# Patient Record
Sex: Female | Born: 1990 | Race: White | Hispanic: No | Marital: Married | Smoking: Never smoker
Health system: Southern US, Community
[De-identification: ages and names within clinical notes are randomized; demographics above are authoritative.]

## PROBLEM LIST (undated history)

## (undated) ENCOUNTER — Inpatient Hospital Stay (HOSPITAL_COMMUNITY): Payer: Self-pay

## (undated) DIAGNOSIS — F32A Depression, unspecified: Secondary | ICD-10-CM

## (undated) DIAGNOSIS — Q211 Atrial septal defect, unspecified: Secondary | ICD-10-CM

## (undated) DIAGNOSIS — N390 Urinary tract infection, site not specified: Secondary | ICD-10-CM

## (undated) DIAGNOSIS — D649 Anemia, unspecified: Secondary | ICD-10-CM

## (undated) HISTORY — PX: TONSILLECTOMY: SUR1361

## (undated) HISTORY — DX: Atrial septal defect, unspecified: Q21.10

## (undated) HISTORY — DX: Atrial septal defect: Q21.1

---
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
## 2010-10-15 DIAGNOSIS — A90 Dengue fever [classical dengue]: Secondary | ICD-10-CM

## 2010-10-15 HISTORY — DX: Dengue fever (classical dengue): A90

## 2013-10-15 DIAGNOSIS — Q211 Atrial septal defect, unspecified: Secondary | ICD-10-CM

## 2013-10-15 HISTORY — DX: Atrial septal defect, unspecified: Q21.10

## 2013-10-15 HISTORY — PX: ASD REPAIR: SHX258

## 2013-10-15 HISTORY — PX: OTHER SURGICAL HISTORY: SHX169

## 2020-10-15 NOTE — L&D Delivery Note (Signed)
OB/GYN Faculty Practice Delivery Note  Bridget Price is a 30 y.o. G2P0010 s/p NSVD at [redacted]w[redacted]d. She was admitted for PROM.   ROM: 57h 70m with clear fluid GBS Status: negative Maximum Maternal Temperature: 102.2, received IV Ampicillin/Gentamycin during labor  Labor Progress: Presented after PROM on 8/20 at 1445, received cytotec x2 and foley balloon that came out, then received pitocin and progressed to fully. Head was at zero station and patient labored down for ~30 minutes and then started pushing. After one hour of pushing, patient was labored down further for another 30 minutes due to maternal fatigue. After that patient pushed again and delivered as detailed below.  Delivery Date/Time: 0021 on 06/06/2021 Delivery: Called to room and patient was pushing. Head delivered. Loose nuchal cord present and was delivered through . Shoulder and body delivered in usual fashion. Infant with spontaneous cry, placed on mother's abdomen, dried and stimulated. Cord clamped x 2 after 1-minute delay, and cut by father of baby Jimmey Ralph. Cord blood drawn. Placenta delivered spontaneously with gentle cord traction. Fundus firm with massage and Pitocin. Labia, perineum, vagina, and cervix inspected, and patient found to have a second degree laceration that extended to the right and was repaired with 3-0 vicryl in a running fashion. Initially the extension to the right was bleeding, however there was excellent hemostasis after the suture was placed. Patient was given TXA for an EBL >500 with continual oozing from the laceration prior to hemostasis was achieved with the repair  Placenta: Intact, three vessel cord, sent to L&D Complications: None Lacerations: 2nd degree EBL: 550 Analgesia: Epidural    Infant: Female  APGARs 8,9  weight pending.  Warner Mccreedy, MD, MPH OB Fellow, [facility] for [facility], [facility] 06/06/2021, 1:49 AM

## 2020-11-03 ENCOUNTER — Encounter (HOSPITAL_COMMUNITY): Payer: Self-pay | Admitting: Obstetrics & Gynecology

## 2020-11-03 ENCOUNTER — Inpatient Hospital Stay (HOSPITAL_COMMUNITY)
Admission: AD | Admit: 2020-11-03 | Discharge: 2020-11-03 | Disposition: A | Discharge status: Home / Self Care | Payer: No Typology Code available for payment source | Attending: Obstetrics & Gynecology | Admitting: Obstetrics & Gynecology

## 2020-11-03 ENCOUNTER — Inpatient Hospital Stay (HOSPITAL_COMMUNITY): Payer: No Typology Code available for payment source

## 2020-11-03 ENCOUNTER — Other Ambulatory Visit: Payer: Self-pay

## 2020-11-03 DIAGNOSIS — O4691 Antepartum hemorrhage, unspecified, first trimester: Secondary | ICD-10-CM

## 2020-11-03 DIAGNOSIS — Z3490 Encounter for supervision of normal pregnancy, unspecified, unspecified trimester: Secondary | ICD-10-CM

## 2020-11-03 DIAGNOSIS — Z3A01 Less than 8 weeks gestation of pregnancy: Secondary | ICD-10-CM

## 2020-11-03 DIAGNOSIS — O209 Hemorrhage in early pregnancy, unspecified: Secondary | ICD-10-CM | POA: Diagnosis not present

## 2020-11-03 LAB — CBC
HCT: 39.5 % (ref 36.0–46.0)
Hemoglobin: 13.3 g/dL (ref 12.0–15.0)
MCH: 31 pg (ref 26.0–34.0)
MCHC: 33.7 g/dL (ref 30.0–36.0)
MCV: 92.1 fL (ref 80.0–100.0)
Platelets: 212 10*3/uL (ref 150–400)
RBC: 4.29 MIL/uL (ref 3.87–5.11)
RDW: 11.8 % (ref 11.5–15.5)
WBC: 5.4 10*3/uL (ref 4.0–10.5)
nRBC: 0 % (ref 0.0–0.2)

## 2020-11-03 LAB — POCT PREGNANCY, URINE: Preg Test, Ur: POSITIVE — AB

## 2020-11-03 LAB — ABO/RH: ABO/RH(D): A POS

## 2020-11-03 LAB — HCG, QUANTITATIVE, PREGNANCY: hCG, Beta Chain, Quant, S: 29516 m[IU]/mL — ABNORMAL HIGH (ref <5)

## 2020-11-03 NOTE — Discharge Instructions (Signed)
Constipation, Adult Constipation is when a person has trouble pooping (having a bowel movement). When you have this condition, you may poop fewer than 3 times a week. Your poop (stool) may also be dry, hard, or bigger than normal. Follow these instructions at home: Eating and drinking  Eat foods that have a lot of fiber, such as: ? Fresh fruits and vegetables. ? Whole grains. ? Beans.  Eat less of foods that are low in fiber and high in fat and sugar, such as: ? French fries. ? Hamburgers. ? Cookies. ? Candy. ? Soda.  Drink enough fluid to keep your pee (urine) pale yellow.   General instructions  Exercise regularly or as told by your doctor. Try to do 150 minutes of exercise each week.  Go to the restroom when you feel like you need to poop. Do not hold it in.  Take over-the-counter and prescription medicines only as told by your doctor. These include any fiber supplements.  When you poop: ? Do deep breathing while relaxing your lower belly (abdomen). ? Relax your pelvic floor. The pelvic floor is a group of muscles that support the rectum, bladder, and intestines (as well as the uterus in women).  Watch your condition for any changes. Tell your doctor if you notice any.  Keep all follow-up visits as told by your doctor. This is important. Contact a doctor if:  You have pain that gets worse.  You have a fever.  You have not pooped for 4 days.  You vomit.  You are not hungry.  You lose weight.  You are bleeding from the opening of the butt (anus).  You have thin, pencil-like poop. Get help right away if:  You have a fever, and your symptoms suddenly get worse.  You leak poop or have blood in your poop.  Your belly feels hard or bigger than normal (bloated).  You have very bad belly pain.  You feel dizzy or you faint. Summary  Constipation is when a person poops fewer than 3 times a week, has trouble pooping, or has poop that is dry, hard, or bigger than  normal.  Eat foods that have a lot of fiber.  Drink enough fluid to keep your pee (urine) pale yellow.  Take over-the-counter and prescription medicines only as told by your doctor. These include any fiber supplements. This information is not intended to replace advice given to you by your health care provider. Make sure you discuss any questions you have with your health care provider. Document Revised: 08/19/2019 Document Reviewed: 08/19/2019 Elsevier Patient Education  2021 Elsevier Inc.  

## 2020-11-03 NOTE — MAU Note (Signed)
Pt reports she has had cramping for the past 3-4 days, got a little worse today. Went o the BR and had some bleeding and then passed a few small clots.  Last  Intercourse Monday.

## 2020-11-03 NOTE — MAU Provider Note (Signed)
History     CSN: [id number]  Arrival date and time: 11/03/20 0865   Event Date/Time   First Provider Initiated Contact with Patient 11/03/20 2026      Chief Complaint  Patient presents with  . Vaginal Bleeding   HPI Bridget Price is a 30 y.o. G1P0 at [redacted]w[redacted]d by LMP who presents to MAU with chief complaint of abdominal cramping. This is a new problem, onset about 4 days ago. Her pain is suprapubic, does not radiate but became slightly more intense today. She denies aggravating or alleviating factors.   Patient also c/o vaginal bleeding. This is a new problem, onset today, first visualized when she wiped after voiding. She endorses seeing multiple small clots but estimates they were "very small' and "not even the size of a dime". She denies overt active bleeding. Most recent sexual intercourse Monday 10/31/2020.  Patient reports constipation, new onset this week. She has attempted management with daily Miralax in her water bottle but has not experienced relief. She is intermittently adherent to a Paleo approach to nutrition. She endorses one fast food meal in the past few weeks.  Patient has a New OB appointment scheduled for 11/17/2020.  OB History    Gravida  1   Para      Term      Preterm      AB      Living        SAB      IAB      Ectopic      Multiple      Live Births              History reviewed. No pertinent past medical history.  Past Surgical History:  Procedure Laterality Date  . ASD REPAIR  2015  . TONSILLECTOMY      History reviewed. No pertinent family history.  Social History   Tobacco Use  . Smoking status: Never Smoker  Substance Use Topics  . Alcohol use: Not Currently  . Drug use: Never    Allergies: No Known Allergies  No medications prior to admission.    Review of Systems  Gastrointestinal: Positive for abdominal pain.  Genitourinary: Positive for vaginal bleeding.  All other systems reviewed and are  negative.  Physical Exam   Blood pressure 120/69, pulse 80, resp. rate 18, height 5\' 3"  (1.6 m), weight 53.1 kg, last menstrual period 09/21/2020.  Physical Exam Vitals and nursing note reviewed. Exam conducted with a chaperone present.  Constitutional:      Appearance: Normal appearance.  Cardiovascular:     Rate and Rhythm: Normal rate.     Pulses: Normal pulses.     Heart sounds: Normal heart sounds.  Pulmonary:     Effort: Pulmonary effort is normal.     Breath sounds: Normal breath sounds.  Abdominal:     General: Abdomen is flat. Bowel sounds are normal.     Tenderness: There is no abdominal tenderness. There is no right CVA tenderness or left CVA tenderness.  Skin:    Capillary Refill: Capillary refill takes less than 2 seconds.  Neurological:     Mental Status: She is alert and oriented to person, place, and time.  Psychiatric:        Mood and Affect: Mood normal.        Behavior: Behavior normal.        Thought Content: Thought content normal.        Judgment: Judgment normal.    MAU  Course  Procedures  --Discussed gas and constipation as common first trimester complaints and common Paleo complaints. Advised increasing cardiovascular activity, continue Miralax PRN, focus on leafy green vegetables, avoid vegetables known to trigger gas e.g. broccoli  --Discussed concern for fetal heart rate of 52, typically would expect heart rate closer to 100 bpm. No actions required and no restrictions on patient activity beyond typical pregnancy restrictions.  Orders Placed This Encounter  Procedures  . US OB LESS THAN 14 WEEKS WITH OB TRANSVAGINAL  . CBC  . hCG, quantitative, pregnancy  . Pregnancy, urine POC  . ABO/Rh  . Discharge patient   Patient Vitals for the past 24 hrs:  BP Pulse Resp Height Weight  11/03/20 1858 120/69 80 18 5\' 3"  (1.6 m) 53.1 kg   Results for orders placed or performed during the hospital encounter of 11/03/20 (from the past 24 hour(s))   Pregnancy, urine POC     Status: Abnormal   Collection Time: 11/03/20  7:13 PM  Result Value Ref Range   Preg Test, Ur POSITIVE (A) NEGATIVE  CBC     Status: None   Collection Time: 11/03/20  7:30 PM  Result Value Ref Range   WBC 5.4 4.0 - 10.5 K/uL   RBC 4.29 3.87 - 5.11 MIL/uL   Hemoglobin 13.3 12.0 - 15.0 g/dL   HCT 11/05/20 66.0 - 63.0 %   MCV 92.1 80.0 - 100.0 fL   MCH 31.0 26.0 - 34.0 pg   MCHC 33.7 30.0 - 36.0 g/dL   RDW 16.0 10.9 - 32.3 %   Platelets 212 150 - 400 K/uL   nRBC 0.0 0.0 - 0.2 %  hCG, quantitative, pregnancy     Status: Abnormal   Collection Time: 11/03/20  7:30 PM  Result Value Ref Range   hCG, Beta Chain, Quant, S 29,516 (H) <5 mIU/mL  ABO/Rh     Status: None   Collection Time: 11/03/20  7:30 PM  Result Value Ref Range   ABO/RH(D) A POS    No rh immune globuloin      NOT A RH IMMUNE GLOBULIN CANDIDATE, PT RH POSITIVE Performed at [facility], [address], [place], [place]    [address] OB LESS THAN 14 WEEKS WITH US TRANSVAGINAL  Result Date: 11/03/2020 CLINICAL DATA:  Cramps. Estimated gestational age by last menstrual period equals 6 weeks 1 day EXAM: OBSTETRIC <14 WK 11/05/2020 AND TRANSVAGINAL OB US TECHNIQUE: Both transabdominal and transvaginal ultrasound examinations were performed for complete evaluation of the gestation as well as the maternal uterus, adnexal regions, and pelvic cul-de-sac. Transvaginal technique was performed to assess early pregnancy. COMPARISON:  None. FINDINGS: Intrauterine gestational sac: Single Yolk sac:  Present Embryo:  Present Cardiac Activity: Present Heart Rate: 52 bpm MSD: 14 mm   6 w    2 d CRL:  1.7 mm --  too small to calculate estimated gestational age Subchorionic hemorrhage:  None Maternal uterus/adnexae: Normal uterus and ovaries.  No free fluid IMPRESSION: 1. Single intrauterine gestation with embryo and detectable cardiac activity. 2. Estimated gestational age by mean sac diameter equal 6 weeks 2 days  Electronically Signed   By: [place] M.D.   On: 11/03/2020 20:17   Assessment and Plan  --30 y.o. G1P0 with confirmed IUP c/w LMP --Fetal heart rate noted to be 52 bpm --Blood type A POS --Discharge home in stable condition  F/U: --Patient to keep New OB appt for 11/17/2020  01/15/2021, CNM  11/03/2020, 9:14 PM

## 2020-11-04 ENCOUNTER — Telehealth: Payer: Self-pay | Admitting: General Practice

## 2020-11-17 ENCOUNTER — Ambulatory Visit (INDEPENDENT_AMBULATORY_CARE_PROVIDER_SITE_OTHER): Payer: No Typology Code available for payment source | Admitting: Family Medicine

## 2020-11-17 ENCOUNTER — Encounter: Payer: Self-pay | Admitting: Family Medicine

## 2020-11-17 ENCOUNTER — Other Ambulatory Visit (HOSPITAL_COMMUNITY)
Admission: RE | Admit: 2020-11-17 | Discharge: 2020-11-17 | Disposition: A | Discharge status: Home / Self Care | Payer: No Typology Code available for payment source | Source: Ambulatory Visit | Attending: Family Medicine | Admitting: Family Medicine

## 2020-11-17 ENCOUNTER — Other Ambulatory Visit: Payer: Self-pay

## 2020-11-17 VITALS — BP 107/45 | HR 70 | Wt 119.0 lb

## 2020-11-17 DIAGNOSIS — Z8774 Personal history of (corrected) congenital malformations of heart and circulatory system: Secondary | ICD-10-CM | POA: Insufficient documentation

## 2020-11-17 DIAGNOSIS — Z3400 Encounter for supervision of normal first pregnancy, unspecified trimester: Secondary | ICD-10-CM | POA: Diagnosis present

## 2020-11-17 DIAGNOSIS — Z3401 Encounter for supervision of normal first pregnancy, first trimester: Secondary | ICD-10-CM

## 2020-11-17 DIAGNOSIS — Z3A08 8 weeks gestation of pregnancy: Secondary | ICD-10-CM | POA: Diagnosis not present

## 2020-11-17 NOTE — Progress Notes (Signed)
DATING AND VIABILITY SONOGRAM   Bridget Price is a 30 y.o. year old G1P0 with LMP Patient's last menstrual period was 09/21/2020. which would correlate to  [redacted]w[redacted]d weeks gestation.  She has regular menstrual cycles.   She is here today for a confirmatory initial sonogram.    GESTATION: SINGLETON     FETAL ACTIVITY:          Heart rate        155 bpm          The fetus is active.    GESTATIONAL AGE AND  BIOMETRICS:  Gestational criteria: Estimated Date of Delivery: 06/28/21 by LMP now at [redacted]w[redacted]d  Previous Scans:1  GESTATIONAL SAC           1.58 cm         8-0 weeks  CROWN RUMP LENGTH           1.64 cm         8-0 weeks                                                                               AVERAGE EGA(BY THIS SCAN): 8-0 weeks  WORKING EDD( LMP ): 06-28-2021     TECHNICIAN COMMENTS: Patient informed that the ultrasound is considered a limited obstetric ultrasound and is not intended to be a complete ultrasound exam. Patient also informed that the ultrasound is not being completed with the intent of assessing for fetal or placental anomalies or any pelvic abnormalities. Explained that the purpose of today's ultrasound is to assess for fetal heart rate. Patient acknowledges the purpose of the exam and the limitations of the study.    Armandina Stammer 11/17/2020 3:10 PM

## 2020-11-17 NOTE — Progress Notes (Signed)
Subjective:  Bridget Price is a G1P0 [redacted]w[redacted]d being seen today for her first obstetrical visit.  Her obstetrical history is significant for personal history of ASD. Repaired approximately 6 years ago. Last echo was about 3 years ago - normal LVEF and no shunting detected. She is pretty active (biking, rock climbing, skiing, etc) without symptoms. Patient does intend to breast feed. Pregnancy history fully reviewed.  Patient reports mild nausea in evening.  BP (!) 107/45   Pulse 70   Wt 119 lb (54 kg)   LMP 09/21/2020   BMI 21.08 kg/m   HISTORY: OB History  Gravida Para Term Preterm AB Living  1            SAB IAB Ectopic Multiple Live Births               # Outcome Date GA Lbr Len/2nd Weight Sex Delivery Anes PTL Lv  1 Current             Past Medical History:  Diagnosis Date  . ASD (atrial septal defect)     Past Surgical History:  Procedure Laterality Date  . ASD REPAIR  2015  . ASD repair  2015  . TONSILLECTOMY    . TONSILLECTOMY      Family History  Problem Relation Age of Onset  . Hypertension Father   . Cancer Maternal Aunt   . Cancer Maternal Grandmother        melnoma and breast  . Cancer Maternal Grandfather        lung CA- smoker  . Cancer Paternal Grandmother        melnoma  . Hypertension Paternal Grandmother   . Cancer Paternal Grandfather        pancreatic     Exam  BP (!) 107/45   Pulse 70   Wt 119 lb (54 kg)   LMP 09/21/2020   BMI 21.08 kg/m   Chaperone present during exam  CONSTITUTIONAL: Well-developed, well-nourished female in no acute distress.  HENT:  Normocephalic, atraumatic, External right and left ear normal. Oropharynx is clear and moist EYES: Conjunctivae and EOM are normal. Pupils are equal, round, and reactive to light. No scleral icterus.  NECK: Normal range of motion, supple, no masses.  Normal thyroid.  CARDIOVASCULAR: Normal heart rate noted, regular rhythm RESPIRATORY: Clear to auscultation bilaterally. Effort and  breath sounds normal, no problems with respiration noted. BREASTS: Symmetric in size. No masses, skin changes, nipple drainage, or lymphadenopathy. ABDOMEN: Soft, normal bowel sounds, no distention noted.  No tenderness, rebound or guarding.  PELVIC: Normal appearing external genitalia; normal appearing vaginal mucosa and cervix. No abnormal discharge noted. Normal uterine size, no other palpable masses, no uterine or adnexal tenderness. MUSCULOSKELETAL: Normal range of motion. No tenderness.  No cyanosis, clubbing, or edema.  2+ distal pulses. SKIN: Skin is warm and dry. No rash noted. Not diaphoretic. No erythema. No pallor. NEUROLOGIC: Alert and oriented to person, place, and time. Normal reflexes, muscle tone coordination. No cranial nerve deficit noted. PSYCHIATRIC: Normal mood and affect. Normal behavior. Normal judgment and thought content.    Assessment:    Pregnancy: G1P0 Patient Active Problem List   Diagnosis Date Noted  . Intrauterine pregnancy 11/03/2020  . FATIGUE 11/26/2008  . ABDOMINAL PAIN 11/26/2008      Plan:   1. Supervision of normal first pregnancy, antepartum FHT and FH normal Discussed delivery at [facility]  Discussed use of midwives, fellows, etc. Interested in [place], but needs to see  if insurance covers it. - CBC/D/Plt+RPR+Rh+ABO+Rub Ab... - Urine Culture - Enroll Patient in Babyscripts - GC/Chlamydia probe amp ([place])not at [facility]  2. History of repair of congenital atrial septal defect (ASD) Will need Fetal echo. At this point, appears remote from surgery and should be fine. Not having any symptoms of shunting. I recommended that she contact her Cardiologist about pregnancy to see if they would recommend an echo.  3. [redacted] weeks gestation of pregnancy  - CBC/D/Plt+RPR+Rh+ABO+Rub Ab... - Urine Culture - Enroll Patient in Babyscripts     Problem list reviewed and updated. 75% of 30 min visit spent on counseling and coordination of care.     Levie Heritage 11/17/2020

## 2020-11-18 LAB — CBC/D/PLT+RPR+RH+ABO+RUB AB...
Antibody Screen: NEGATIVE
Basophils Absolute: 0 10*3/uL (ref 0.0–0.2)
Basos: 1 %
EOS (ABSOLUTE): 0.2 10*3/uL (ref 0.0–0.4)
Eos: 3 %
HCV Ab: <0.1 s/co ratio (ref 0.0–0.9)
HIV Screen 4th Generation wRfx: NONREACTIVE
Hematocrit: 37.7 % (ref 34.0–46.6)
Hemoglobin: 13.2 g/dL (ref 11.1–15.9)
Hepatitis B Surface Ag: NEGATIVE
Immature Grans (Abs): 0 10*3/uL (ref 0.0–0.1)
Immature Granulocytes: 0 %
Lymphocytes Absolute: 1.5 10*3/uL (ref 0.7–3.1)
Lymphs: 24 %
MCH: 31.5 pg (ref 26.6–33.0)
MCHC: 35 g/dL (ref 31.5–35.7)
MCV: 90 fL (ref 79–97)
Monocytes Absolute: 0.4 10*3/uL (ref 0.1–0.9)
Monocytes: 7 %
Neutrophils Absolute: 4 10*3/uL (ref 1.4–7.0)
Neutrophils: 65 %
Platelets: 208 10*3/uL (ref 150–450)
RBC: 4.19 x10E6/uL (ref 3.77–5.28)
RDW: 12.3 % (ref 11.7–15.4)
RPR Ser Ql: NONREACTIVE
Rh Factor: POSITIVE
Rubella Antibodies, IGG: 2.77 index (ref >0.99)
WBC: 6.1 10*3/uL (ref 3.4–10.8)

## 2020-11-18 LAB — HCV INTERPRETATION

## 2020-11-20 LAB — GC/CHLAMYDIA PROBE AMP (~~LOC~~) NOT AT ARMC
Chlamydia: NEGATIVE
Comment: NEGATIVE
Comment: NORMAL
Neisseria Gonorrhea: NEGATIVE

## 2020-11-22 LAB — URINE CULTURE

## 2020-12-14 ENCOUNTER — Other Ambulatory Visit: Payer: Self-pay

## 2020-12-14 ENCOUNTER — Ambulatory Visit (INDEPENDENT_AMBULATORY_CARE_PROVIDER_SITE_OTHER): Payer: No Typology Code available for payment source | Admitting: Family Medicine

## 2020-12-14 VITALS — BP 100/54 | HR 71 | Wt 119.0 lb

## 2020-12-14 DIAGNOSIS — Z8774 Personal history of (corrected) congenital malformations of heart and circulatory system: Secondary | ICD-10-CM

## 2020-12-14 DIAGNOSIS — Z3400 Encounter for supervision of normal first pregnancy, unspecified trimester: Secondary | ICD-10-CM

## 2020-12-14 DIAGNOSIS — Z3A12 12 weeks gestation of pregnancy: Secondary | ICD-10-CM

## 2020-12-14 MED ORDER — ONDANSETRON HCL 4 MG PO TABS: 4.0000 mg | ORAL_TABLET | Freq: Three times a day (TID) | ORAL | 6 refills | Status: DC | PRN

## 2020-12-14 NOTE — Progress Notes (Signed)
   PRENATAL VISIT NOTE  Subjective:  Bridget Price is a 30 y.o. G1P0 at [redacted]w[redacted]d being seen today for ongoing prenatal care.  She is currently monitored for the following issues for this low-risk pregnancy and has FATIGUE; ABDOMINAL PAIN; Intrauterine pregnancy; History of repair of congenital atrial septal defect (ASD); and Supervision of normal first pregnancy, antepartum on their problem list.  Patient reports nausea.  Contractions: Not present. Vag. Bleeding: None.  Movement: Absent. Denies leaking of fluid.   The following portions of the patient's history were reviewed and updated as appropriate: allergies, current medications, past family history, past medical history, past social history, past surgical history and problem list.   Objective:   Vitals:   12/14/20 1112  BP: (!) 100/54  Pulse: 71  Weight: 119 lb (54 kg)    Fetal Status: Fetal Heart Rate (bpm): 160   Movement: Absent     General:  Alert, oriented and cooperative. Patient is in no acute distress.  Skin: Skin is warm and dry. No rash noted.   Cardiovascular: Normal heart rate noted  Respiratory: Normal respiratory effort, no problems with respiration noted  Abdomen: Soft, gravid, appropriate for gestational age.  Pain/Pressure: Absent     Pelvic: Cervical exam deferred        Extremities: Normal range of motion.  Edema: None  Mental Status: Normal mood and affect. Normal behavior. Normal judgment and thought content.   Assessment and Plan:  Pregnancy: G1P0 at [redacted]w[redacted]d 1. [redacted] weeks gestation of pregnancy - [place] MFM OB COMP + 14 WK; Future  2. Supervision of normal first pregnancy, antepartum FHT and FH normal  3. History of repair of congenital atrial septal defect (ASD) Schedule fetal echo  Preterm labor symptoms and general obstetric precautions including but not limited to vaginal bleeding, contractions, leaking of fluid and fetal movement were reviewed in detail with the patient. Please refer to After Visit  Summary for other counseling recommendations.   Return in about 4 weeks (around 01/11/2021) for OB f/u.  Future Appointments  Date Time Provider Department Center  01/12/2021 11:15 AM Levie Heritage, DO [facility] None  02/01/2021 12:45 PM [facility] US4 [facility]    Levie Heritage, DO

## 2020-12-21 ENCOUNTER — Other Ambulatory Visit: Payer: Self-pay

## 2020-12-21 ENCOUNTER — Ambulatory Visit (INDEPENDENT_AMBULATORY_CARE_PROVIDER_SITE_OTHER): Payer: No Typology Code available for payment source

## 2020-12-21 DIAGNOSIS — Z3A13 13 weeks gestation of pregnancy: Secondary | ICD-10-CM

## 2020-12-21 NOTE — Progress Notes (Signed)
Pt presents for [facility] labs. Labs were drawn and sent to [facility]. Bridget Price l Shean Gerding, CMA

## 2021-01-12 ENCOUNTER — Ambulatory Visit (INDEPENDENT_AMBULATORY_CARE_PROVIDER_SITE_OTHER): Payer: No Typology Code available for payment source | Admitting: Family Medicine

## 2021-01-12 VITALS — BP 101/58 | HR 82 | Wt 124.0 lb

## 2021-01-12 DIAGNOSIS — Z3400 Encounter for supervision of normal first pregnancy, unspecified trimester: Secondary | ICD-10-CM

## 2021-01-12 DIAGNOSIS — Z8774 Personal history of (corrected) congenital malformations of heart and circulatory system: Secondary | ICD-10-CM

## 2021-01-12 DIAGNOSIS — Z3A16 16 weeks gestation of pregnancy: Secondary | ICD-10-CM

## 2021-01-12 NOTE — Progress Notes (Signed)
   PRENATAL VISIT NOTE  Subjective:  Bridget Price is a 30 y.o. G1P0 at [redacted]w[redacted]d being seen today for ongoing prenatal care.  She is currently monitored for the following issues for this low-risk pregnancy and has FATIGUE; ABDOMINAL PAIN; Intrauterine pregnancy; History of repair of congenital atrial septal defect (ASD); and Supervision of normal first pregnancy, antepartum on their problem list.  Patient reports no complaints.  Contractions: Not present. Vag. Bleeding: None.  Movement: Absent. Denies leaking of fluid.   The following portions of the patient's history were reviewed and updated as appropriate: allergies, current medications, past family history, past medical history, past social history, past surgical history and problem list.   Objective:   Vitals:   01/12/21 1130  BP: (!) 101/58  Pulse: 82  Weight: 124 lb (56.2 kg)    Fetal Status: Fetal Heart Rate (bpm): 141   Movement: Absent     General:  Alert, oriented and cooperative. Patient is in no acute distress.  Skin: Skin is warm and dry. No rash noted.   Cardiovascular: Normal heart rate noted  Respiratory: Normal respiratory effort, no problems with respiration noted  Abdomen: Soft, gravid, appropriate for gestational age.  Pain/Pressure: Absent     Pelvic: Cervical exam deferred        Extremities: Normal range of motion.  Edema: None  Mental Status: Normal mood and affect. Normal behavior. Normal judgment and thought content.   Assessment and Plan:  Pregnancy: G1P0 at [redacted]w[redacted]d 1. [redacted] weeks gestation of pregnancy  2. Supervision of normal first pregnancy, antepartum FHT and FH normal. 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 A brief literature search did not reveal any information about previous dengue infections with current pregnancies. Patient reassured  3. History of repair of congenital atrial septal defect  (ASD) Discussed fetal echo as part of standard of care.  Preterm labor symptoms and general obstetric precautions including but not limited to vaginal bleeding, contractions, leaking of fluid and fetal movement were reviewed in detail with the patient. Please refer to After Visit Summary for other counseling recommendations.   No follow-ups on file.  Future Appointments  Date Time Provider Department Center  02/01/2021 12:45 PM [facility] US4 [facility]    Levie Heritage, DO

## 2021-01-31 ENCOUNTER — Other Ambulatory Visit: Payer: Self-pay | Admitting: Family Medicine

## 2021-01-31 DIAGNOSIS — Z3A19 19 weeks gestation of pregnancy: Secondary | ICD-10-CM

## 2021-01-31 DIAGNOSIS — Q249 Congenital malformation of heart, unspecified: Secondary | ICD-10-CM

## 2021-01-31 DIAGNOSIS — O99412 Diseases of the circulatory system complicating pregnancy, second trimester: Secondary | ICD-10-CM

## 2021-02-01 ENCOUNTER — Ambulatory Visit: Payer: No Typology Code available for payment source | Attending: Obstetrics and Gynecology

## 2021-02-01 ENCOUNTER — Other Ambulatory Visit: Payer: Self-pay

## 2021-02-01 DIAGNOSIS — Q249 Congenital malformation of heart, unspecified: Secondary | ICD-10-CM | POA: Diagnosis not present

## 2021-02-01 DIAGNOSIS — O358XX0 Maternal care for other (suspected) fetal abnormality and damage, not applicable or unspecified: Secondary | ICD-10-CM | POA: Diagnosis not present

## 2021-02-01 DIAGNOSIS — Z87798 Personal history of other (corrected) congenital malformations: Secondary | ICD-10-CM | POA: Insufficient documentation

## 2021-02-01 DIAGNOSIS — Z363 Encounter for antenatal screening for malformations: Secondary | ICD-10-CM | POA: Insufficient documentation

## 2021-02-01 DIAGNOSIS — O99412 Diseases of the circulatory system complicating pregnancy, second trimester: Secondary | ICD-10-CM | POA: Diagnosis not present

## 2021-02-01 DIAGNOSIS — Z3A19 19 weeks gestation of pregnancy: Secondary | ICD-10-CM | POA: Insufficient documentation

## 2021-02-02 ENCOUNTER — Other Ambulatory Visit: Payer: Self-pay | Admitting: *Deleted

## 2021-02-02 DIAGNOSIS — Z8774 Personal history of (corrected) congenital malformations of heart and circulatory system: Secondary | ICD-10-CM

## 2021-02-09 ENCOUNTER — Encounter: Payer: No Typology Code available for payment source | Admitting: Family Medicine

## 2021-02-15 ENCOUNTER — Other Ambulatory Visit: Payer: Self-pay

## 2021-02-15 ENCOUNTER — Ambulatory Visit (INDEPENDENT_AMBULATORY_CARE_PROVIDER_SITE_OTHER): Payer: No Typology Code available for payment source | Admitting: Family Medicine

## 2021-02-15 VITALS — BP 105/63 | HR 74 | Wt 130.0 lb

## 2021-02-15 DIAGNOSIS — Z3400 Encounter for supervision of normal first pregnancy, unspecified trimester: Secondary | ICD-10-CM

## 2021-02-15 DIAGNOSIS — Z3A21 21 weeks gestation of pregnancy: Secondary | ICD-10-CM

## 2021-02-15 DIAGNOSIS — Z8774 Personal history of (corrected) congenital malformations of heart and circulatory system: Secondary | ICD-10-CM

## 2021-02-15 MED ORDER — FAMOTIDINE 20 MG PO TABS: 20.0000 mg | ORAL_TABLET | Freq: Two times a day (BID) | ORAL | 3 refills | Status: DC

## 2021-02-15 MED ORDER — ONDANSETRON HCL 4 MG PO TABS: 4.0000 mg | ORAL_TABLET | Freq: Three times a day (TID) | ORAL | 6 refills | Status: DC | PRN

## 2021-02-15 NOTE — Progress Notes (Signed)
   PRENATAL VISIT NOTE  Subjective:  Bridget Price is a 30 y.o. G1P0 at [redacted]w[redacted]d being seen today for ongoing prenatal care.  She is currently monitored for the following issues for this low-risk pregnancy and has FATIGUE; ABDOMINAL PAIN; Intrauterine pregnancy; History of repair of congenital atrial septal defect (ASD); and Supervision of normal first pregnancy, antepartum on their problem list.  Patient reports heartburn and nausea at night. Seems to be triggered by PNV.  Contractions: Not present. Vag. Bleeding: None.  Movement: Present. Denies leaking of fluid.   The following portions of the patient's history were reviewed and updated as appropriate: allergies, current medications, past family history, past medical history, past social history, past surgical history and problem list.   Objective:   Vitals:   02/15/21 1113  BP: 105/63  Pulse: 74  Weight: 130 lb (59 kg)    Fetal Status: Fetal Heart Rate (bpm): 140 Fundal Height: 21 cm Movement: Present     General:  Alert, oriented and cooperative. Patient is in no acute distress.  Skin: Skin is warm and dry. No rash noted.   Cardiovascular: Normal heart rate noted  Respiratory: Normal respiratory effort, no problems with respiration noted  Abdomen: Soft, gravid, appropriate for gestational age.  Pain/Pressure: Absent     Pelvic: Cervical exam deferred        Extremities: Normal range of motion.  Edema: None  Mental Status: Normal mood and affect. Normal behavior. Normal judgment and thought content.   Assessment and Plan:  Pregnancy: G1P0 at [redacted]w[redacted]d 1. [redacted] weeks gestation of pregnancy  2. Supervision of normal first pregnancy, antepartum FHT and FH normal. Take zofran and pepcid. Stop PNV for now.  3. History of repair of congenital atrial septal defect (ASD) Normal fetal echo.  Preterm labor symptoms and general obstetric precautions including but not limited to vaginal bleeding, contractions, leaking of fluid and fetal movement  were reviewed in detail with the patient. Please refer to After Visit Summary for other counseling recommendations.   No follow-ups on file.  Future Appointments  Date Time Provider Department Center  03/01/2021  1:00 PM [facility] NURSE [facility]  03/01/2021  1:15 PM [facility] US2 [facility]  03/22/2021  8:30 AM Levie Heritage, DO [facility] None  04/07/2021 11:15 AM Levie Heritage, DO [facility] None  04/19/2021 11:15 AM Levie Heritage, DO [facility] None  05/04/2021 11:15 AM Levie Heritage, DO [facility] None    Levie Heritage, DO

## 2021-03-01 ENCOUNTER — Encounter: Payer: Self-pay | Admitting: *Deleted

## 2021-03-01 ENCOUNTER — Ambulatory Visit: Payer: No Typology Code available for payment source | Attending: Obstetrics

## 2021-03-01 ENCOUNTER — Other Ambulatory Visit: Payer: Self-pay

## 2021-03-01 ENCOUNTER — Other Ambulatory Visit: Payer: Self-pay | Admitting: Maternal & Fetal Medicine

## 2021-03-01 ENCOUNTER — Ambulatory Visit: Payer: No Typology Code available for payment source | Admitting: *Deleted

## 2021-03-01 VITALS — BP 107/68 | HR 86

## 2021-03-01 DIAGNOSIS — Z8774 Personal history of (corrected) congenital malformations of heart and circulatory system: Secondary | ICD-10-CM | POA: Insufficient documentation

## 2021-03-01 DIAGNOSIS — Z362 Encounter for other antenatal screening follow-up: Secondary | ICD-10-CM | POA: Diagnosis present

## 2021-03-01 DIAGNOSIS — O283 Abnormal ultrasonic finding on antenatal screening of mother: Secondary | ICD-10-CM | POA: Diagnosis not present

## 2021-03-01 DIAGNOSIS — Z3A23 23 weeks gestation of pregnancy: Secondary | ICD-10-CM

## 2021-03-22 ENCOUNTER — Ambulatory Visit (INDEPENDENT_AMBULATORY_CARE_PROVIDER_SITE_OTHER): Payer: No Typology Code available for payment source | Admitting: Family Medicine

## 2021-03-22 ENCOUNTER — Other Ambulatory Visit: Payer: Self-pay

## 2021-03-22 ENCOUNTER — Encounter: Payer: No Typology Code available for payment source | Admitting: Family Medicine

## 2021-03-22 VITALS — BP 113/68 | HR 81 | Temp 98.2°F | Wt 136.0 lb

## 2021-03-22 DIAGNOSIS — Z3A26 26 weeks gestation of pregnancy: Secondary | ICD-10-CM

## 2021-03-22 DIAGNOSIS — R252 Cramp and spasm: Secondary | ICD-10-CM

## 2021-03-22 DIAGNOSIS — O99891 Other specified diseases and conditions complicating pregnancy: Secondary | ICD-10-CM

## 2021-03-22 DIAGNOSIS — O358XX0 Maternal care for other (suspected) fetal abnormality and damage, not applicable or unspecified: Secondary | ICD-10-CM

## 2021-03-22 DIAGNOSIS — Z3400 Encounter for supervision of normal first pregnancy, unspecified trimester: Secondary | ICD-10-CM

## 2021-03-22 DIAGNOSIS — N3000 Acute cystitis without hematuria: Secondary | ICD-10-CM

## 2021-03-22 DIAGNOSIS — O35EXX0 Maternal care for other (suspected) fetal abnormality and damage, fetal genitourinary anomalies, not applicable or unspecified: Secondary | ICD-10-CM

## 2021-03-22 DIAGNOSIS — Z8774 Personal history of (corrected) congenital malformations of heart and circulatory system: Secondary | ICD-10-CM

## 2021-03-22 NOTE — Progress Notes (Signed)
   PRENATAL VISIT NOTE  Subjective:  Bridget Price is a 30 y.o. G1P0 at [redacted]w[redacted]d being seen today for ongoing prenatal care.  She is currently monitored for the following issues for this low-risk pregnancy and has FATIGUE; ABDOMINAL PAIN; Intrauterine pregnancy; History of repair of congenital atrial septal defect (ASD); Supervision of normal first pregnancy, antepartum; and Pyelectasis of fetus on prenatal ultrasound on their problem list.  Patient reports had UTI - per PCP treated with Keflex x7 days. Symptoms resolved at this point. bilateral pyelectasis on [place] - has follow up US scheduled.  Contractions: Not present. Vag. Bleeding: None.  Movement: Present. Denies leaking of fluid.   The following portions of the patient's history were reviewed and updated as appropriate: allergies, current medications, past family history, past medical history, past social history, past surgical history and problem list.   Objective:   Vitals:   03/22/21 1434  BP: 113/68  Pulse: 81  Temp: 98.2 F (36.8 C)  Weight: 136 lb (61.7 kg)    Fetal Status: Fetal Heart Rate (bpm): 153   Movement: Present     General:  Alert, oriented and cooperative. Patient is in no acute distress.  Skin: Skin is warm and dry. No rash noted.   Cardiovascular: Normal heart rate noted  Respiratory: Normal respiratory effort, no problems with respiration noted  Abdomen: Soft, gravid, appropriate for gestational age.  Pain/Pressure: Absent     Pelvic: Cervical exam deferred        Extremities: Normal range of motion.  Edema: None  Mental Status: Normal mood and affect. Normal behavior. Normal judgment and thought content.   Assessment and Plan:  Pregnancy: G1P0 at [redacted]w[redacted]d 1. [redacted] weeks gestation of pregnancy  2. Supervision of normal first pregnancy, antepartum FHT and FH normal. Some constipation - discussed Mag Citrate - has used miralax before without help.  3. Leg cramps in pregnancy Occurring at night. Wear Compression  stockings. Check labs. Start iron supplement - CBC - Magnesium - Comp Met (CMET)  4. History of repair of congenital atrial septal defect (ASD) Normal fetal echo  5. Pyelectasis of fetus on prenatal ultrasound Has follow up US. Discussed monitoring - may need [place] after delivery, depending on urinary function  6. Acute cystitis without hematuria Check Culture for clearance. - Culture, OB Urine  Preterm labor symptoms and general obstetric precautions including but not limited to vaginal bleeding, contractions, leaking of fluid and fetal movement were reviewed in detail with the patient. Please refer to After Visit Summary for other counseling recommendations.   No follow-ups on file.  Future Appointments  Date Time Provider [place]  03/31/2021  8:25 AM [facility] NURSE [facility] None  04/05/2021  1:00 PM [facility] NURSE [facility]  04/05/2021  1:15 PM [facility] US2 [facility]  04/07/2021 11:15 AM Truett Mainland, DO [facility] None  04/19/2021 11:15 AM Truett Mainland, DO [facility] None  05/04/2021 11:15 AM Truett Mainland, DO [facility] None    Truett Mainland, DO

## 2021-03-23 LAB — CBC
Hematocrit: 31.1 % — ABNORMAL LOW (ref 34.0–46.6)
Hemoglobin: 10.7 g/dL — ABNORMAL LOW (ref 11.1–15.9)
MCH: 31.8 pg (ref 26.6–33.0)
MCHC: 34.4 g/dL (ref 31.5–35.7)
MCV: 92 fL (ref 79–97)
Platelets: 175 10*3/uL (ref 150–450)
RBC: 3.37 x10E6/uL — ABNORMAL LOW (ref 3.77–5.28)
RDW: 12 % (ref 11.7–15.4)
WBC: 7.7 10*3/uL (ref 3.4–10.8)

## 2021-03-23 LAB — COMPREHENSIVE METABOLIC PANEL
ALT: 25 IU/L (ref 0–32)
AST: 19 IU/L (ref 0–40)
Albumin/Globulin Ratio: 1.7 (ref 1.2–2.2)
Albumin: 3.7 g/dL — ABNORMAL LOW (ref 3.9–5.0)
Alkaline Phosphatase: 50 IU/L (ref 44–121)
BUN/Creatinine Ratio: 16 (ref 9–23)
BUN: 9 mg/dL (ref 6–20)
Bilirubin Total: 0.2 mg/dL (ref 0.0–1.2)
CO2: 23 mmol/L (ref 20–29)
Calcium: 8.9 mg/dL (ref 8.7–10.2)
Chloride: 103 mmol/L (ref 96–106)
Creatinine, Ser: 0.55 mg/dL — ABNORMAL LOW (ref 0.57–1.00)
Globulin, Total: 2.2 g/dL (ref 1.5–4.5)
Glucose: 91 mg/dL (ref 65–99)
Potassium: 3.8 mmol/L (ref 3.5–5.2)
Sodium: 140 mmol/L (ref 134–144)
Total Protein: 5.9 g/dL — ABNORMAL LOW (ref 6.0–8.5)
eGFR: 127 mL/min/{1.73_m2} (ref >59)

## 2021-03-23 LAB — MAGNESIUM: Magnesium: 1.8 mg/dL (ref 1.6–2.3)

## 2021-03-24 LAB — URINE CULTURE, OB REFLEX

## 2021-03-24 LAB — CULTURE, OB URINE

## 2021-03-27 ENCOUNTER — Other Ambulatory Visit: Payer: Self-pay

## 2021-03-27 DIAGNOSIS — N390 Urinary tract infection, site not specified: Secondary | ICD-10-CM

## 2021-03-27 MED ORDER — CEPHALEXIN 500 MG PO CAPS: 500.0000 mg | ORAL_CAPSULE | Freq: Three times a day (TID) | ORAL | 0 refills | Status: DC

## 2021-03-27 NOTE — Progress Notes (Signed)
Pt called stating she is on vacation and she is having UTI symptoms x 3 days.Pt states she was told to call for a Rx if she has symptoms. Keflex 500 QID x 7 days was sent to her pharmacy in [place]. Javonte Elenes l Tiawana Forgy, CMA

## 2021-03-31 ENCOUNTER — Other Ambulatory Visit: Payer: No Typology Code available for payment source

## 2021-03-31 ENCOUNTER — Other Ambulatory Visit: Payer: Self-pay

## 2021-03-31 DIAGNOSIS — Z3A27 27 weeks gestation of pregnancy: Secondary | ICD-10-CM

## 2021-03-31 NOTE — Progress Notes (Signed)
Pt presents today only for 2 hour GTT. Sent to lab .

## 2021-04-01 LAB — GLUCOSE TOLERANCE, 2 HOURS W/ 1HR
Glucose, 1 hour: 126 mg/dL (ref 65–179)
Glucose, 2 hour: 97 mg/dL (ref 65–152)
Glucose, Fasting: 73 mg/dL (ref 65–91)

## 2021-04-01 LAB — CBC
Hematocrit: 31.6 % — ABNORMAL LOW (ref 34.0–46.6)
Hemoglobin: 10.5 g/dL — ABNORMAL LOW (ref 11.1–15.9)
MCH: 31.3 pg (ref 26.6–33.0)
MCHC: 33.2 g/dL (ref 31.5–35.7)
MCV: 94 fL (ref 79–97)
Platelets: 143 10*3/uL — ABNORMAL LOW (ref 150–450)
RBC: 3.36 x10E6/uL — ABNORMAL LOW (ref 3.77–5.28)
RDW: 11.7 % (ref 11.7–15.4)
WBC: 7.5 10*3/uL (ref 3.4–10.8)

## 2021-04-01 LAB — HIV ANTIBODY (ROUTINE TESTING W REFLEX): HIV Screen 4th Generation wRfx: NONREACTIVE

## 2021-04-01 LAB — RPR: RPR Ser Ql: NONREACTIVE

## 2021-04-04 ENCOUNTER — Encounter: Payer: Self-pay | Admitting: Obstetrics & Gynecology

## 2021-04-04 DIAGNOSIS — O99019 Anemia complicating pregnancy, unspecified trimester: Secondary | ICD-10-CM | POA: Insufficient documentation

## 2021-04-05 ENCOUNTER — Ambulatory Visit: Payer: No Typology Code available for payment source | Attending: Maternal & Fetal Medicine

## 2021-04-05 ENCOUNTER — Ambulatory Visit: Payer: No Typology Code available for payment source | Admitting: *Deleted

## 2021-04-05 ENCOUNTER — Other Ambulatory Visit: Payer: Self-pay

## 2021-04-05 ENCOUNTER — Telehealth: Payer: Self-pay

## 2021-04-05 ENCOUNTER — Encounter: Payer: Self-pay | Admitting: *Deleted

## 2021-04-05 VITALS — BP 105/57 | HR 70

## 2021-04-05 DIAGNOSIS — Z87798 Personal history of other (corrected) congenital malformations: Secondary | ICD-10-CM | POA: Diagnosis not present

## 2021-04-05 DIAGNOSIS — O283 Abnormal ultrasonic finding on antenatal screening of mother: Secondary | ICD-10-CM

## 2021-04-05 DIAGNOSIS — O99019 Anemia complicating pregnancy, unspecified trimester: Secondary | ICD-10-CM

## 2021-04-05 DIAGNOSIS — Z3A28 28 weeks gestation of pregnancy: Secondary | ICD-10-CM

## 2021-04-05 NOTE — Telephone Encounter (Signed)
Called pt to discuss results. Left message for pt to call the office back.  Samhitha Rosen l Macaiah Mangal, CMA  

## 2021-04-05 NOTE — Telephone Encounter (Signed)
-----   Message from Willodean Rosenthal, MD sent at 04/04/2021  1:26 PM EDT ----- Message for pt. It does not appear that she has Mychart. If she does, you can cut and paste the message below.    Clh-S   Your labs show that you are anemic or, have a low blood count.  You need to begin Ferrous Sulfate (FeSO4) daily, which you can get over the counter at any pharmacy or drugstroe. Insurance does not cover this. It is usually pretty inexpensive.   You should take this med on an empty stomach with 1/2 cup of Orange juice or Vit C. You may eat after 1/2 hour ( ).  Your stools may turn dark and you may develop some constipation while on this medication.  Let us know if that becomes a problem.   As always, please call with questions.

## 2021-04-07 ENCOUNTER — Ambulatory Visit (INDEPENDENT_AMBULATORY_CARE_PROVIDER_SITE_OTHER): Payer: No Typology Code available for payment source | Admitting: Family Medicine

## 2021-04-07 ENCOUNTER — Other Ambulatory Visit: Payer: Self-pay

## 2021-04-07 VITALS — BP 110/63 | HR 79 | Wt 140.2 lb

## 2021-04-07 DIAGNOSIS — Z3400 Encounter for supervision of normal first pregnancy, unspecified trimester: Secondary | ICD-10-CM

## 2021-04-07 DIAGNOSIS — O99019 Anemia complicating pregnancy, unspecified trimester: Secondary | ICD-10-CM

## 2021-04-07 DIAGNOSIS — O35EXX0 Maternal care for other (suspected) fetal abnormality and damage, fetal genitourinary anomalies, not applicable or unspecified: Secondary | ICD-10-CM

## 2021-04-07 DIAGNOSIS — Z3A28 28 weeks gestation of pregnancy: Secondary | ICD-10-CM

## 2021-04-07 DIAGNOSIS — O358XX0 Maternal care for other (suspected) fetal abnormality and damage, not applicable or unspecified: Secondary | ICD-10-CM

## 2021-04-07 DIAGNOSIS — Z8774 Personal history of (corrected) congenital malformations of heart and circulatory system: Secondary | ICD-10-CM

## 2021-04-07 DIAGNOSIS — R399 Unspecified symptoms and signs involving the genitourinary system: Secondary | ICD-10-CM

## 2021-04-07 LAB — POCT URINALYSIS DIPSTICK OB
Bilirubin, UA: NEGATIVE
Blood, UA: NEGATIVE
Glucose, UA: NEGATIVE
Ketones, UA: NEGATIVE
Nitrite, UA: NEGATIVE
Spec Grav, UA: 1.015 (ref 1.010–1.025)
Urobilinogen, UA: 0.2 E.U./dL
pH, UA: 7.5 (ref 5.0–8.0)

## 2021-04-07 MED ORDER — NITROFURANTOIN MONOHYD MACRO 100 MG PO CAPS: 100.0000 mg | ORAL_CAPSULE | Freq: Every day | ORAL | 1 refills | Status: DC

## 2021-04-07 NOTE — Progress Notes (Signed)
   PRENATAL VISIT NOTE  Subjective:  Bridget Price is a 30 y.o. G1P0 at [redacted]w[redacted]d being seen today for ongoing prenatal care.  She is currently monitored for the following issues for this low-risk pregnancy and has FATIGUE; ABDOMINAL PAIN; Intrauterine pregnancy; History of repair of congenital atrial septal defect (ASD); Supervision of normal first pregnancy, antepartum; and Anemia, antepartum on their problem list.  Patient reports no complaints.  Contractions: Not present. Vag. Bleeding: None.  Movement: Present. Denies leaking of fluid.   The following portions of the patient's history were reviewed and updated as appropriate: allergies, current medications, past family history, past medical history, past social history, past surgical history and problem list.   Objective:   Vitals:   04/07/21 1127  BP: 110/63  Pulse: 79  Weight: 140 lb 4 oz (63.6 kg)    Fetal Status: Fetal Heart Rate (bpm): 144   Movement: Present     General:  Alert, oriented and cooperative. Patient is in no acute distress.  Skin: Skin is warm and dry. No rash noted.   Cardiovascular: Normal heart rate noted  Respiratory: Normal respiratory effort, no problems with respiration noted  Abdomen: Soft, gravid, appropriate for gestational age.  Pain/Pressure: Absent     Pelvic: Cervical exam deferred        Extremities: Normal range of motion.  Edema: None  Mental Status: Normal mood and affect. Normal behavior. Normal judgment and thought content.   Assessment and Plan:  Pregnancy: G1P0 at [redacted]w[redacted]d 1. [redacted] weeks gestation of pregnancy  2. Supervision of normal first pregnancy, antepartum FHT and FH normal  3. History of repair of congenital atrial septal defect (ASD)  4. Pyelectasis of fetus on prenatal ultrasound resolved  5. UTI symptoms 2 UTIs during pregnancy. Check for clearing today. Start ppx at bedtime. - POC Urinalysis Dipstick OB - Culture, OB Urine  6. Anemia, antepartum On iron  Preterm labor  symptoms and general obstetric precautions including but not limited to vaginal bleeding, contractions, leaking of fluid and fetal movement were reviewed in detail with the patient. Please refer to After Visit Summary for other counseling recommendations.   No follow-ups on file.  Future Appointments  Date Time Provider Department Center  04/19/2021 11:15 AM Levie Heritage, DO [facility] None  05/04/2021  2:00 PM Levie Heritage, DO [facility] None  05/19/2021 10:15 AM Reva Bores, MD [facility] None  05/31/2021 10:15 AM Levie Heritage, DO [facility] None  06/07/2021 10:15 AM Levie Heritage, DO [facility] None  06/14/2021 10:15 AM Levie Heritage, DO [facility] None    Levie Heritage, DO

## 2021-04-09 LAB — CULTURE, OB URINE

## 2021-04-09 LAB — URINE CULTURE, OB REFLEX

## 2021-04-19 ENCOUNTER — Other Ambulatory Visit: Payer: Self-pay

## 2021-04-19 ENCOUNTER — Ambulatory Visit (INDEPENDENT_AMBULATORY_CARE_PROVIDER_SITE_OTHER): Payer: No Typology Code available for payment source | Admitting: Family Medicine

## 2021-04-19 VITALS — BP 107/58 | HR 84 | Wt 144.0 lb

## 2021-04-19 DIAGNOSIS — Z8774 Personal history of (corrected) congenital malformations of heart and circulatory system: Secondary | ICD-10-CM

## 2021-04-19 DIAGNOSIS — Z3400 Encounter for supervision of normal first pregnancy, unspecified trimester: Secondary | ICD-10-CM

## 2021-04-19 DIAGNOSIS — Z3A30 30 weeks gestation of pregnancy: Secondary | ICD-10-CM

## 2021-04-19 MED ORDER — ONDANSETRON HCL 4 MG PO TABS: 4.0000 mg | ORAL_TABLET | Freq: Three times a day (TID) | ORAL | 6 refills | Status: DC | PRN

## 2021-04-19 NOTE — Progress Notes (Signed)
+   Fetal movement. Pt states she has had some increased cramping over the last 24 hours. States it did get better after a bowel movement. Denies any loss of fluid or UTI symptoms. States she is staying hydrated.

## 2021-04-19 NOTE — Progress Notes (Signed)
   PRENATAL VISIT NOTE  Subjective:  Bridget Price is a 30 y.o. G1P0 at [redacted]w[redacted]d being seen today for ongoing prenatal care.  She is currently monitored for the following issues for this low-risk pregnancy and has FATIGUE; ABDOMINAL PAIN; Intrauterine pregnancy; History of repair of congenital atrial septal defect (ASD); Supervision of normal first pregnancy, antepartum; and Anemia, antepartum on their problem list.  Patient reports  increased nausea and vomiting that have increased over the past couple of days. Had contractions over past 24 hours. Did have sex recently .  Contractions: Irritability. Vag. Bleeding: None.  Movement: Present. Denies leaking of fluid.   The following portions of the patient's history were reviewed and updated as appropriate: allergies, current medications, past family history, past medical history, past social history, past surgical history and problem list.   Objective:   Vitals:   04/19/21 1055  BP: (!) 107/58  Pulse: 84  Weight: 144 lb (65.3 kg)    Fetal Status: Fetal Heart Rate (bpm): 139   Movement: Present     General:  Alert, oriented and cooperative. Patient is in no acute distress.  Skin: Skin is warm and dry. No rash noted.   Cardiovascular: Normal heart rate noted  Respiratory: Normal respiratory effort, no problems with respiration noted  Abdomen: Soft, gravid, appropriate for gestational age.  Pain/Pressure: Present     Pelvic: Cervical exam performed in the presence of a chaperone        Extremities: Normal range of motion.  Edema: None  Mental Status: Normal mood and affect. Normal behavior. Normal judgment and thought content.   Assessment and Plan:  Pregnancy: G1P0 at [redacted]w[redacted]d 1. [redacted] weeks gestation of pregnancy  2. Supervision of normal first pregnancy, antepartum FHT and FH normal. No evidence of preterm labor  3. History of repair of congenital atrial septal defect (ASD)   Preterm labor symptoms and general obstetric precautions  including but not limited to vaginal bleeding, contractions, leaking of fluid and fetal movement were reviewed in detail with the patient. Please refer to After Visit Summary for other counseling recommendations.   No follow-ups on file.  Future Appointments  Date Time Provider Department Center  05/04/2021  2:00 PM Levie Heritage, DO [facility] None  05/19/2021 10:15 AM Reva Bores, MD [facility] None  05/31/2021 10:15 AM Levie Heritage, DO [facility] None  06/07/2021 10:15 AM Levie Heritage, DO [facility] None  06/14/2021 10:15 AM Levie Heritage, DO [facility] None    Levie Heritage, DO

## 2021-05-02 ENCOUNTER — Inpatient Hospital Stay (HOSPITAL_COMMUNITY)
Admission: AD | Admit: 2021-05-02 | Discharge: 2021-05-03 | Disposition: A | Discharge status: Home / Self Care | Payer: No Typology Code available for payment source | Attending: Family Medicine | Admitting: Family Medicine

## 2021-05-02 ENCOUNTER — Other Ambulatory Visit: Payer: Self-pay

## 2021-05-02 ENCOUNTER — Encounter (HOSPITAL_COMMUNITY): Payer: Self-pay | Admitting: Family Medicine

## 2021-05-02 DIAGNOSIS — Z3A31 31 weeks gestation of pregnancy: Secondary | ICD-10-CM | POA: Insufficient documentation

## 2021-05-02 DIAGNOSIS — R197 Diarrhea, unspecified: Secondary | ICD-10-CM

## 2021-05-02 DIAGNOSIS — R102 Pelvic and perineal pain: Secondary | ICD-10-CM | POA: Insufficient documentation

## 2021-05-02 DIAGNOSIS — N949 Unspecified condition associated with female genital organs and menstrual cycle: Secondary | ICD-10-CM

## 2021-05-02 DIAGNOSIS — R350 Frequency of micturition: Secondary | ICD-10-CM | POA: Diagnosis not present

## 2021-05-02 DIAGNOSIS — B9689 Other specified bacterial agents as the cause of diseases classified elsewhere: Secondary | ICD-10-CM

## 2021-05-02 DIAGNOSIS — Z79899 Other long term (current) drug therapy: Secondary | ICD-10-CM | POA: Diagnosis not present

## 2021-05-02 DIAGNOSIS — O23593 Infection of other part of genital tract in pregnancy, third trimester: Secondary | ICD-10-CM | POA: Insufficient documentation

## 2021-05-02 DIAGNOSIS — N760 Acute vaginitis: Secondary | ICD-10-CM | POA: Diagnosis not present

## 2021-05-02 DIAGNOSIS — Z20822 Contact with and (suspected) exposure to covid-19: Secondary | ICD-10-CM | POA: Insufficient documentation

## 2021-05-02 DIAGNOSIS — O26893 Other specified pregnancy related conditions, third trimester: Secondary | ICD-10-CM | POA: Diagnosis present

## 2021-05-02 LAB — URINALYSIS, ROUTINE W REFLEX MICROSCOPIC
Bilirubin Urine: NEGATIVE
Glucose, UA: NEGATIVE mg/dL
Hgb urine dipstick: NEGATIVE
Ketones, ur: NEGATIVE mg/dL
Leukocytes,Ua: NEGATIVE
Nitrite: NEGATIVE
Protein, ur: NEGATIVE mg/dL
Specific Gravity, Urine: 1.005 (ref 1.005–1.030)
pH: 7 (ref 5.0–8.0)

## 2021-05-02 MED ORDER — CYCLOBENZAPRINE HCL 5 MG PO TABS
10.0000 mg | ORAL_TABLET | Freq: Once | ORAL | Status: AC
  Administered 2021-05-03: 10 mg via ORAL
  Filled 2021-05-02: qty 2

## 2021-05-02 MED ORDER — ACETAMINOPHEN 500 MG PO TABS
1000.0000 mg | ORAL_TABLET | Freq: Once | ORAL | Status: AC
  Administered 2021-05-03: 1000 mg via ORAL
  Filled 2021-05-02: qty 2

## 2021-05-02 NOTE — MAU Note (Addendum)
Pt reports dull pelvic pain that started around 1900. Pt reports intensive waves of pain intermittently.   Denies vaginal bleeding.   Pt reports increased milky yellow discharge today.    Pt reports constipation for 7 months, but today was able to have 5 loose stools.   Reports +FM   Pt reports nausea

## 2021-05-02 NOTE — MAU Provider Note (Signed)
History     [id number]  Arrival date and time: 05/02/21 2155    Chief Complaint  Patient presents with   Abdominal Pain     HPI Murphy Bundick is a 30 y.o. at [redacted]w[redacted]d by LMP with PMHx notable for ASD repair (age 56, 2018), who presents for abdominal pain/cramping, diarrhea, vaginal discharge.  Abdominal pain/cramping Started today, intermittent in nature, every few minutes, 4/10. Has not taken any meds for this issue. No recent intercourse. +FM. No vaginal bleeding. Did endorse similar symptoms 04/19/21 at her clinic appt after intercourse, cervical check performed at that time, cervical exam not documented, but assume no PTL.  Vaginal discharge Yellow/white, increased in volume today. No thick discharge. No new sexual partners. No history of STIs. No changes in body washes, perfumes, lotions etc. No history of similar things in the past. No dysuria, urgency, hematuria. Does endorse urinary frequency. Denies any associated skin changes.  Diarrhea Patient endorses 5 loose bowel movements today. Previously has been constipated the entire pregnancy. This is a new problem. No dietary changes. No hematochezia, melena, BRBPR. No pain with stools. No nausea or emesis. No hematemesis. No recent travel. No meds. No fever/chills.   A/Positive/-- (02/03 1606)  OB History     Gravida  2   Para      Term      Preterm      AB  1   Living         SAB  1   IAB      Ectopic      Multiple      Live Births              Past Medical History:  Diagnosis Date   ASD (atrial septal defect)     Past Surgical History:  Procedure Laterality Date   ASD REPAIR  2015   ASD repair  2015   TONSILLECTOMY     TONSILLECTOMY      Family History  Problem Relation Age of Onset   Hypertension Father    Cancer Maternal Aunt    Cancer Maternal Grandmother        melnoma and breast   Cancer Maternal Grandfather        lung CA- smoker   Cancer Paternal Grandmother        melnoma    Hypertension Paternal Grandmother    Cancer Paternal Grandfather        pancreatic    Social History   Socioeconomic History   Marital status: Married    Spouse name: Jimmey Ralph   Number of children: Not on file   Years of education: Not on file   Highest education level: Bachelor's degree (e.g., BA, AB, BS)  Occupational History   Not on file  Tobacco Use   Smoking status: Never   Smokeless tobacco: Never  Vaping Use   Vaping Use: Never used  Substance and Sexual Activity   Alcohol use: Yes    Comment: socially   Drug use: Never   Sexual activity: Yes  Other Topics Concern   Not on file  Social History Narrative   Not on file   Social Determinants of Health   Financial Resource Strain: Not on file  Food Insecurity: Not on file  Transportation Needs: Not on file  Physical Activity: Not on file  Stress: Not on file  Social Connections: Not on file  Intimate Partner Violence: Not on file    Allergies  Allergen Reactions   Oxycodone  Rash    No current facility-administered medications on file prior to encounter.   Current Outpatient Medications on File Prior to Encounter  Medication Sig Dispense Refill   famotidine (PEPCID) 20 MG tablet Take 1 tablet (20 mg total) by mouth 2 (two) times daily. 60 tablet 3   ondansetron (ZOFRAN) 4 MG tablet Take 1 tablet (4 mg total) by mouth every 8 (eight) hours as needed for nausea or vomiting. 60 tablet 6   prenatal vitamin w/FE, FA (PRENATAL 1 + 1) 27-1 MG TABS tablet Take 1 tablet by mouth daily at 12 noon.       Review of Systems  Constitutional:  Negative for chills and fever.  Eyes:  Negative for blurred vision and double vision.  Respiratory:  Negative for cough, hemoptysis and shortness of breath.   Cardiovascular:  Negative for chest pain, palpitations and leg swelling.  Gastrointestinal:  Positive for abdominal pain and diarrhea. Negative for blood in stool, constipation, melena, nausea and vomiting.  Genitourinary:   Positive for frequency. Negative for dysuria, flank pain, hematuria and urgency.  Musculoskeletal:  Negative for myalgias.  Skin:  Negative for itching and rash.  Neurological:  Negative for dizziness, loss of consciousness, weakness and headaches.  Psychiatric/Behavioral: Negative.     Pertinent positives and negative per HPI, all others reviewed and negative  Physical Exam   BP 114/61 (BP Location: Left Arm)   Pulse 82   Temp 98.9 F (37.2 C) (Oral)   Resp 14   Wt 66.8 kg   LMP 09/21/2020   SpO2 99%   BMI 26.08 kg/m   Physical Exam Vitals and nursing note reviewed. Exam conducted with a chaperone present.  Constitutional:      General: She is not in acute distress.    Appearance: Normal appearance. She is normal weight.  HENT:     Head: Normocephalic and atraumatic.     Nose: Nose normal.     Mouth/Throat:     Mouth: Mucous membranes are moist.     Pharynx: Oropharynx is clear.  Eyes:     Extraocular Movements: Extraocular movements intact.     Conjunctiva/sclera: Conjunctivae normal.  Cardiovascular:     Rate and Rhythm: Normal rate.     Pulses: Normal pulses.  Pulmonary:     Effort: Pulmonary effort is normal.  Abdominal:     Palpations: Abdomen is soft.     Tenderness: There is no abdominal tenderness. There is no guarding or rebound.  Musculoskeletal:        General: Normal range of motion.     Cervical back: Normal range of motion and neck supple.  Skin:    General: Skin is warm and dry.  Neurological:     General: No focal deficit present.     Mental Status: She is alert and oriented to person, place, and time. Mental status is at baseline.  Psychiatric:        Mood and Affect: Mood normal.        Behavior: Behavior normal.    Cervical Exam Dilation: 1 Effacement (%): 50 Cervical Position: Middle Station: -2 Presentation: Vertex Exam by:: Germaine Pomfret, MD  Bedside Ultrasound Not indicated  My interpretation: n/a  FHT Baseline 130bpm, mod  variability, +accels, no decels Toco: q7-8, irregular Cat: 1  Labs Results for orders placed or performed during the hospital encounter of 05/02/21 (from the past 24 hour(s))  Urinalysis, Routine w reflex microscopic Urine, Clean Catch     Status: Abnormal  Collection Time: 05/02/21 10:28 PM  Result Value Ref Range   Color, Urine STRAW (A) YELLOW   APPearance CLEAR CLEAR   Specific Gravity, Urine 1.005 1.005 - 1.030   pH 7.0 5.0 - 8.0   Glucose, UA NEGATIVE NEGATIVE mg/dL   Hgb urine dipstick NEGATIVE NEGATIVE   Bilirubin Urine NEGATIVE NEGATIVE   Ketones, ur NEGATIVE NEGATIVE mg/dL   Protein, ur NEGATIVE NEGATIVE mg/dL   Nitrite NEGATIVE NEGATIVE   Leukocytes,Ua NEGATIVE NEGATIVE    Imaging No results found.  MAU Course  Procedures Lab Orders  Resp Panel by RT-PCR (Flu A&B, Covid) Nasopharyngeal Swab  Wet prep, genital  Urinalysis, Routine w reflex microscopic Urine, Clean Catch  Meds ordered this encounter  Medications   acetaminophen (TYLENOL) tablet 1,000 mg   cyclobenzaprine (FLEXERIL) tablet 10 mg   Imaging Orders  No imaging studies ordered today    MDM moderate  Assessment and Plan  30 yo G2P0010 with no significant PMH presents with:  #Round ligament pain Intermittent abdominal pain and cramping, unable to quantify frequency. Similar symptoms documented 04/19/21 after intercourse at clinic visit. Cervical exam unchanged in MAU. NST reactive. No concern for PTL at this time. Pain improved with tylenol and flexeril. Counseled on conservative management for round ligament pain with tylenol, heat, stretching etc. Rx for flexeril sent to pharmacy.  #Diarrhea Patient presents with 1 day of diarrhea with no red flag symptoms and no significant inciting event. VSS. Appears well hydrated on physical exam. COVID neg. Encouraged increased hydration, BRAT diet, follow up if symptoms persist >48 hours. No associated upper GI symptoms.   #Bacterial vaginosis 1d of  increased vaginal discharge yellow/white. Wet prep demonstrates WBCs and clue cells, concerning for BV given sudden onset increased malodorous discharge. GCC pending. No new sexual contacts. UA unremarkable. Rx for flagyl sent to pharmacy.  #FWB FHT Cat 1 NST: reactive  Alric Seton

## 2021-05-03 DIAGNOSIS — O26893 Other specified pregnancy related conditions, third trimester: Secondary | ICD-10-CM | POA: Diagnosis not present

## 2021-05-03 DIAGNOSIS — R197 Diarrhea, unspecified: Secondary | ICD-10-CM

## 2021-05-03 DIAGNOSIS — N760 Acute vaginitis: Secondary | ICD-10-CM

## 2021-05-03 DIAGNOSIS — B9689 Other specified bacterial agents as the cause of diseases classified elsewhere: Secondary | ICD-10-CM | POA: Diagnosis not present

## 2021-05-03 DIAGNOSIS — Z3A31 31 weeks gestation of pregnancy: Secondary | ICD-10-CM

## 2021-05-03 DIAGNOSIS — R102 Pelvic and perineal pain: Secondary | ICD-10-CM | POA: Diagnosis not present

## 2021-05-03 LAB — RESP PANEL BY RT-PCR (FLU A&B, COVID) ARPGX2
Influenza A by PCR: NEGATIVE
Influenza B by PCR: NEGATIVE
SARS Coronavirus 2 by RT PCR: NEGATIVE

## 2021-05-03 LAB — WET PREP, GENITAL
Sperm: NONE SEEN
Trich, Wet Prep: NONE SEEN
Yeast Wet Prep HPF POC: NONE SEEN

## 2021-05-03 MED ORDER — METRONIDAZOLE 500 MG PO TABS: 500.0000 mg | ORAL_TABLET | Freq: Three times a day (TID) | ORAL | 0 refills | Status: AC

## 2021-05-03 MED ORDER — CYCLOBENZAPRINE HCL 10 MG PO TABS
10.0000 mg | ORAL_TABLET | Freq: Three times a day (TID) | ORAL | 0 refills | Status: DC | PRN
Start: 2021-05-03 — End: 2021-06-07

## 2021-05-03 NOTE — Discharge Instructions (Addendum)
-  take tylenol 1000 mg every 6 hours as needed for abdominal pain and flexeril every 8 hours as needed -do not operate machinery/drive etc while taking flexeril, it can make you tired -stay hydrated while you have diarrhea, let us know if symptoms last longer than 3-4 days -flagyl 500 mg twice daily, take with food x7 days for bacterial vaginosis, this will improve discharge -contact clinic if you have any questions/concerns

## 2021-05-04 ENCOUNTER — Encounter: Payer: No Typology Code available for payment source | Admitting: Family Medicine

## 2021-05-04 LAB — GC/CHLAMYDIA PROBE AMP (~~LOC~~) NOT AT ARMC
Chlamydia: NEGATIVE
Comment: NEGATIVE
Comment: NORMAL
Neisseria Gonorrhea: NEGATIVE

## 2021-05-19 ENCOUNTER — Other Ambulatory Visit: Payer: Self-pay

## 2021-05-19 ENCOUNTER — Ambulatory Visit (INDEPENDENT_AMBULATORY_CARE_PROVIDER_SITE_OTHER): Payer: No Typology Code available for payment source | Admitting: Family Medicine

## 2021-05-19 VITALS — BP 113/61 | HR 91 | Wt 147.0 lb

## 2021-05-19 DIAGNOSIS — Z3400 Encounter for supervision of normal first pregnancy, unspecified trimester: Secondary | ICD-10-CM

## 2021-05-19 DIAGNOSIS — O99019 Anemia complicating pregnancy, unspecified trimester: Secondary | ICD-10-CM

## 2021-05-19 DIAGNOSIS — Z3A34 34 weeks gestation of pregnancy: Secondary | ICD-10-CM

## 2021-05-19 NOTE — Progress Notes (Signed)
   PRENATAL VISIT NOTE  Subjective:  Bridget Price is a 30 y.o. G2P0010 at [redacted]w[redacted]d being seen today for ongoing prenatal care.  She is currently monitored for the following issues for this low-risk pregnancy and has FATIGUE; ABDOMINAL PAIN; Intrauterine pregnancy; History of repair of congenital atrial septal defect (ASD); Supervision of normal first pregnancy, antepartum; and Anemia, antepartum on their problem list.  Patient reports no complaints.  Contractions: Irritability. Vag. Bleeding: None.  Movement: Present. Denies leaking of fluid.   The following portions of the patient's history were reviewed and updated as appropriate: allergies, current medications, past family history, past medical history, past social history, past surgical history and problem list.   Objective:   Vitals:   05/19/21 1020  BP: 113/61  Pulse: 91  Weight: 147 lb (66.7 kg)    Fetal Status: Fetal Heart Rate (bpm): 150 Fundal Height: 33 cm Movement: Present     General:  Alert, oriented and cooperative. Patient is in no acute distress.  Skin: Skin is warm and dry. No rash noted.   Cardiovascular: Normal heart rate noted  Respiratory: Normal respiratory effort, no problems with respiration noted  Abdomen: Soft, gravid, appropriate for gestational age.  Pain/Pressure: Present     Pelvic: Cervical exam deferred        Extremities: Normal range of motion.  Edema: None  Mental Status: Normal mood and affect. Normal behavior. Normal judgment and thought content.   Assessment and Plan:  Pregnancy: G2P0010 at [redacted]w[redacted]d 1. Anemia, antepartum   2. Supervision of normal first pregnancy, antepartum Continue routine prenatal care. Maternity band Cultures next visit  Preterm labor symptoms and general obstetric precautions including but not limited to vaginal bleeding, contractions, leaking of fluid and fetal movement were reviewed in detail with the patient. Please refer to After Visit Summary for other counseling  recommendations.   Return in about 2 weeks (around 06/02/2021).  Future Appointments  Date Time Provider Department Center  05/31/2021 10:15 AM Levie Heritage, DO [facility] None  06/07/2021 10:15 AM Levie Heritage, DO [facility] None  06/14/2021 10:15 AM Levie Heritage, DO [facility] None  06/22/2021 10:55 AM Levie Heritage, DO [facility] None  06/29/2021  8:55 AM Reva Bores, MD [facility] None    Reva Bores, MD

## 2021-05-31 ENCOUNTER — Other Ambulatory Visit: Payer: Self-pay

## 2021-05-31 ENCOUNTER — Ambulatory Visit (INDEPENDENT_AMBULATORY_CARE_PROVIDER_SITE_OTHER): Payer: No Typology Code available for payment source | Admitting: Family Medicine

## 2021-05-31 ENCOUNTER — Other Ambulatory Visit (HOSPITAL_COMMUNITY)
Admission: RE | Admit: 2021-05-31 | Discharge: 2021-05-31 | Disposition: A | Discharge status: Home / Self Care | Payer: No Typology Code available for payment source | Source: Ambulatory Visit | Attending: Family Medicine | Admitting: Family Medicine

## 2021-05-31 VITALS — BP 108/66 | HR 87 | Wt 149.0 lb

## 2021-05-31 DIAGNOSIS — Z8774 Personal history of (corrected) congenital malformations of heart and circulatory system: Secondary | ICD-10-CM

## 2021-05-31 DIAGNOSIS — Z3A36 36 weeks gestation of pregnancy: Secondary | ICD-10-CM | POA: Diagnosis present

## 2021-05-31 DIAGNOSIS — Z3400 Encounter for supervision of normal first pregnancy, unspecified trimester: Secondary | ICD-10-CM

## 2021-05-31 DIAGNOSIS — Z3483 Encounter for supervision of other normal pregnancy, third trimester: Secondary | ICD-10-CM | POA: Insufficient documentation

## 2021-05-31 NOTE — Progress Notes (Signed)
   PRENATAL VISIT NOTE  Subjective:  Bridget Price is a 30 y.o. G2P0010 at [redacted]w[redacted]d being seen today for ongoing prenatal care.  She is currently monitored for the following issues for this low-risk pregnancy and has FATIGUE; ABDOMINAL PAIN; Intrauterine pregnancy; History of repair of congenital atrial septal defect (ASD); Supervision of normal first pregnancy, antepartum; and Anemia, antepartum on their problem list.  Patient reports  difficulty sleeping .  Contractions: Not present. Vag. Bleeding: None.  Movement: Present. Denies leaking of fluid.   The following portions of the patient's history were reviewed and updated as appropriate: allergies, current medications, past family history, past medical history, past social history, past surgical history and problem list.   Objective:   Vitals:   05/31/21 1038  BP: 108/66  Pulse: 87  Weight: 149 lb (67.6 kg)    Fetal Status:     Movement: Present     General:  Alert, oriented and cooperative. Patient is in no acute distress.  Skin: Skin is warm and dry. No rash noted.   Cardiovascular: Normal heart rate noted  Respiratory: Normal respiratory effort, no problems with respiration noted  Abdomen: Soft, gravid, appropriate for gestational age.  Pain/Pressure: Absent     Pelvic: Cervical exam deferred. Cultures obtained with chaperone present       Extremities: Normal range of motion.  Edema: Trace  Mental Status: Normal mood and affect. Normal behavior. Normal judgment and thought content.   Assessment and Plan:  Pregnancy: G2P0010 at [redacted]w[redacted]d 1. [redacted] weeks gestation of pregnancy  - Culture, beta strep (group b only) - GC/Chlamydia probe amp ([place])not at [facility]  2. Supervision of normal first pregnancy, antepartum FHT and FH normal. Patient nervous about pain control during labor. Does want epidural. Discussed other pain control options.   3. History of repair of congenital atrial septal defect (ASD) No complications   Preterm  labor symptoms and general obstetric precautions including but not limited to vaginal bleeding, contractions, leaking of fluid and fetal movement were reviewed in detail with the patient. Please refer to After Visit Summary for other counseling recommendations.   No follow-ups on file.  Future Appointments  Date Time Provider Department Center  06/07/2021 10:15 AM Levie Heritage, DO [facility] None  06/14/2021 10:15 AM Levie Heritage, DO [facility] None  06/22/2021 10:55 AM Levie Heritage, DO [facility] None  06/29/2021  8:55 AM Reva Bores, MD [facility] None    Levie Heritage, DO

## 2021-06-01 LAB — GC/CHLAMYDIA PROBE AMP (~~LOC~~) NOT AT ARMC
Chlamydia: NEGATIVE
Comment: NEGATIVE
Comment: NORMAL
Neisseria Gonorrhea: NEGATIVE

## 2021-06-04 ENCOUNTER — Inpatient Hospital Stay (HOSPITAL_COMMUNITY)
Admission: AD | Admit: 2021-06-04 | Discharge: 2021-06-07 | DRG: 805 | Disposition: A | Discharge status: Home / Self Care | Payer: No Typology Code available for payment source | Attending: Obstetrics and Gynecology | Admitting: Obstetrics and Gynecology

## 2021-06-04 ENCOUNTER — Encounter (HOSPITAL_COMMUNITY): Payer: Self-pay | Admitting: Obstetrics and Gynecology

## 2021-06-04 ENCOUNTER — Other Ambulatory Visit: Payer: Self-pay

## 2021-06-04 DIAGNOSIS — O99019 Anemia complicating pregnancy, unspecified trimester: Secondary | ICD-10-CM

## 2021-06-04 DIAGNOSIS — O411230 Chorioamnionitis, third trimester, not applicable or unspecified: Secondary | ICD-10-CM | POA: Diagnosis present

## 2021-06-04 DIAGNOSIS — Z3A36 36 weeks gestation of pregnancy: Secondary | ICD-10-CM | POA: Diagnosis not present

## 2021-06-04 DIAGNOSIS — Z23 Encounter for immunization: Secondary | ICD-10-CM | POA: Diagnosis not present

## 2021-06-04 DIAGNOSIS — Z3400 Encounter for supervision of normal first pregnancy, unspecified trimester: Secondary | ICD-10-CM

## 2021-06-04 DIAGNOSIS — O26893 Other specified pregnancy related conditions, third trimester: Secondary | ICD-10-CM | POA: Diagnosis present

## 2021-06-04 DIAGNOSIS — O411290 Chorioamnionitis, unspecified trimester, not applicable or unspecified: Secondary | ICD-10-CM

## 2021-06-04 DIAGNOSIS — Z20822 Contact with and (suspected) exposure to covid-19: Secondary | ICD-10-CM | POA: Diagnosis present

## 2021-06-04 DIAGNOSIS — O9902 Anemia complicating childbirth: Secondary | ICD-10-CM | POA: Diagnosis present

## 2021-06-04 DIAGNOSIS — Z8774 Personal history of (corrected) congenital malformations of heart and circulatory system: Secondary | ICD-10-CM

## 2021-06-04 DIAGNOSIS — O42913 Preterm premature rupture of membranes, unspecified as to length of time between rupture and onset of labor, third trimester: Principal | ICD-10-CM | POA: Diagnosis present

## 2021-06-04 HISTORY — DX: Depression, unspecified: F32.A

## 2021-06-04 LAB — CBC
HCT: 30.7 % — ABNORMAL LOW (ref 36.0–46.0)
Hemoglobin: 9.8 g/dL — ABNORMAL LOW (ref 12.0–15.0)
MCH: 27.7 pg (ref 26.0–34.0)
MCHC: 31.9 g/dL (ref 30.0–36.0)
MCV: 86.7 fL (ref 80.0–100.0)
Platelets: 145 10*3/uL — ABNORMAL LOW (ref 150–400)
RBC: 3.54 MIL/uL — ABNORMAL LOW (ref 3.87–5.11)
RDW: 13 % (ref 11.5–15.5)
WBC: 11.1 10*3/uL — ABNORMAL HIGH (ref 4.0–10.5)
nRBC: 0 % (ref 0.0–0.2)

## 2021-06-04 LAB — TYPE AND SCREEN
ABO/RH(D): A POS
Antibody Screen: NEGATIVE

## 2021-06-04 LAB — RESP PANEL BY RT-PCR (FLU A&B, COVID) ARPGX2
Influenza A by PCR: NEGATIVE
Influenza B by PCR: NEGATIVE
SARS Coronavirus 2 by RT PCR: NEGATIVE

## 2021-06-04 LAB — POCT FERN TEST: POCT Fern Test: POSITIVE

## 2021-06-04 LAB — CULTURE, BETA STREP (GROUP B ONLY): Strep Gp B Culture: NEGATIVE

## 2021-06-04 MED ORDER — FENTANYL CITRATE (PF) 100 MCG/2ML IJ SOLN
100.0000 ug | INTRAMUSCULAR | Status: DC | PRN
  Administered 2021-06-05: 50 ug via INTRAVENOUS
  Filled 2021-06-04: qty 2

## 2021-06-04 MED ORDER — LACTATED RINGERS IV SOLN: 500.0000 mL | INTRAVENOUS | Status: DC | PRN

## 2021-06-04 MED ORDER — MISOPROSTOL 50MCG HALF TABLET
50.0000 ug | ORAL_TABLET | ORAL | Status: DC | PRN
  Administered 2021-06-04 – 2021-06-05 (×2): 50 ug via BUCCAL
  Filled 2021-06-04 (×2): qty 1

## 2021-06-04 MED ORDER — ACETAMINOPHEN 325 MG PO TABS
650.0000 mg | ORAL_TABLET | ORAL | Status: DC | PRN
  Administered 2021-06-05: 650 mg via ORAL
  Filled 2021-06-04: qty 2

## 2021-06-04 MED ORDER — FENTANYL CITRATE (PF) 100 MCG/2ML IJ SOLN: 50.0000 ug | INTRAMUSCULAR | Status: DC | PRN

## 2021-06-04 MED ORDER — TERBUTALINE SULFATE 1 MG/ML IJ SOLN: 0.2500 mg | Freq: Once | INTRAMUSCULAR | Status: DC | PRN

## 2021-06-04 MED ORDER — LACTATED RINGERS IV SOLN: INTRAVENOUS | Status: DC

## 2021-06-04 MED ORDER — OXYTOCIN BOLUS FROM INFUSION
333.0000 mL | Freq: Once | INTRAVENOUS | Status: AC
  Administered 2021-06-06: 333 mL via INTRAVENOUS

## 2021-06-04 MED ORDER — OXYTOCIN-SODIUM CHLORIDE 30-0.9 UT/500ML-% IV SOLN
2.5000 [IU]/h | INTRAVENOUS | Status: DC
Start: 2021-06-04 — End: 2021-06-06
  Filled 2021-06-04: qty 500

## 2021-06-04 MED ORDER — LIDOCAINE HCL (PF) 1 % IJ SOLN
30.0000 mL | INTRAMUSCULAR | Status: AC | PRN
  Administered 2021-06-06: 30 mL via SUBCUTANEOUS
  Filled 2021-06-04: qty 30

## 2021-06-04 MED ORDER — ONDANSETRON HCL 4 MG/2ML IJ SOLN
4.0000 mg | Freq: Four times a day (QID) | INTRAMUSCULAR | Status: DC | PRN
  Administered 2021-06-05 (×3): 4 mg via INTRAVENOUS
  Filled 2021-06-04 (×3): qty 2

## 2021-06-04 MED ORDER — OXYTOCIN-SODIUM CHLORIDE 30-0.9 UT/500ML-% IV SOLN
1.0000 m[IU]/min | INTRAVENOUS | Status: DC
  Administered 2021-06-05: 2 m[IU]/min via INTRAVENOUS
  Filled 2021-06-04: qty 500

## 2021-06-04 MED ORDER — SOD CITRATE-CITRIC ACID 500-334 MG/5ML PO SOLN: 30.0000 mL | ORAL | Status: DC | PRN

## 2021-06-04 NOTE — H&P (Signed)
OBSTETRIC ADMISSION HISTORY AND PHYSICAL  Bridget Price is a 30 y.o. female G2P0010 with IUP at [redacted]w[redacted]d by LMP c/s [place] presenting for leakage of fluid, loss of mucus plug, and abdominal cramping. She reports +FMs, No LOF, no VB, no blurry vision, headaches or peripheral edema, and RUQ pain.  She plans on breast feeding. She request condoms for birth control. She received her prenatal care at  [facility]    Dating: By LMP --->  Estimated Date of Delivery: 06/28/21  Nursing Staff Provider  Office Location  [facility] Dating  LMP c/w [place]  Language  English Anatomy US  normal  Flu Vaccine   Genetic Screen  NIPS: low risk female.   AFP:      TDaP Vaccine    Hgb A1C or  GTT Early  Third trimester   COVID Vaccine    LAB RESULTS   Rhogam   Blood Type A/Positive/-- (02/03 1606)   Feeding Plan Breast Antibody Negative (02/03 1606)  Contraception  Rubella 2.77 (02/03 1606)  Circumcision  RPR Non Reactive (02/03 1606)   Pediatrician   HBsAg Negative (02/03 1606)   Support Person FOB: [place]  HCVAb Neg  Prenatal Classes  HIV Non Reactive (02/03 1606)     BTL Consent  GBS   (For PCN allergy, check sensitivities)   VBAC Consent  Pap     Hgb Electro    BP Cuff  CF     SMA     Waterbirth  [ ]  Class [ ]  Consent [ ]  CNM visit    Induction  [ ]  Orders Entered [ ] Foley Y/N   Prenatal History/Complications:  --History of ASD repair in 2015 at age 24, fetal echo WNL  --Anemia of pregnancy (10.5 in 03/2021)   Past Medical History: Past Medical History:  Diagnosis Date   ASD (atrial septal defect)    Dengue fever 2012   Depression     Past Surgical History: Past Surgical History:  Procedure Laterality Date   ASD REPAIR  2015   ASD repair  2015   TONSILLECTOMY     TONSILLECTOMY      Obstetrical History: OB History     Gravida  2   Para      Term      Preterm      AB  1   Living         SAB  1   IAB      Ectopic      Multiple      Live Births              Social History Social  History   Socioeconomic History   Marital status: Married    Spouse name: 36   Number of children: Not on file   Years of education: Not on file   Highest education level: Bachelor's degree (e.g., BA, AB, BS)  Occupational History   Not on file  Tobacco Use   Smoking status: Never   Smokeless tobacco: Never  Vaping Use   Vaping Use: Never used  Substance and Sexual Activity   Alcohol use: Never    Comment: socially   Drug use: Never   Sexual activity: Yes  Other Topics Concern   Not on file  Social History Narrative   Not on file   Social Determinants of Health   Financial Resource Strain: Not on file  Food Insecurity: Not on file  Transportation Needs: Not on file  Physical Activity: Not on  file  Stress: Not on file  Social Connections: Not on file    Family History: Family History  Problem Relation Age of Onset   Hypertension Father    Cancer Maternal Aunt    Cancer Maternal Grandmother        melnoma and breast   Cancer Maternal Grandfather        lung CA- smoker   Cancer Paternal Grandmother        melnoma   Hypertension Paternal Grandmother    Cancer Paternal Grandfather        pancreatic    Allergies: Allergies  Allergen Reactions   Oxycodone Rash    Medications Prior to Admission  Medication Sig Dispense Refill Last Dose   ondansetron (ZOFRAN) 4 MG tablet Take 1 tablet (4 mg total) by mouth every 8 (eight) hours as needed for nausea or vomiting. 60 tablet 6 06/03/2021   prenatal vitamin w/FE, FA (PRENATAL 1 + 1) 27-1 MG TABS tablet Take 1 tablet by mouth daily at 12 noon.   06/03/2021   cyclobenzaprine (FLEXERIL) 10 MG tablet Take 1 tablet (10 mg total) by mouth 3 (three) times daily as needed for muscle spasms. (Patient not taking: Reported on 05/31/2021) 30 tablet 0    famotidine (PEPCID) 20 MG tablet Take 1 tablet (20 mg total) by mouth 2 (two) times daily. (Patient not taking: Reported on 05/31/2021) 60 tablet 3      Review of Systems    All systems reviewed and negative except as stated in HPI  Blood pressure 119/72, pulse 90, temperature 98.2 F (36.8 C), temperature source Oral, resp. rate 18, height 5\' 2"  (1.575 m), weight 69.1 kg, last menstrual period 09/21/2020, SpO2 99 %. General appearance: alert, cooperative, and no distress Lungs: clear to auscultation bilaterally Heart: regular rate and rhythm Abdomen: soft, non-tender; bowel sounds normal Pelvic: n/a Extremities: Homans sign is negative, no sign of DVT DTR's +2 Presentation: cephalic Fetal monitoringBaseline: 140 bpm, Variability: Good {> 6 bpm), Accelerations: Reactive, and Decelerations: Absent Uterine activity: occasional uc's Dilation: (P) 1 Effacement (%): (P) Thick Station: (P) -3 Exam by:: (P) [email or web address], CNM   Prenatal labs: ABO, Rh: --/--/A POS (08/21 1836) Antibody: NEG (08/21 1836) Rubella: 2.77 (02/03 1606) RPR: Non Reactive (06/17 0839)  HBsAg: Negative (02/03 1606)  HIV: Non Reactive (06/17 0839)  GBS: Negative/-- (08/17 1134)   Prenatal Transfer Tool  Maternal Diabetes: No Genetic Screening: Normal Maternal Ultrasounds/Referrals: Normal Fetal Ultrasounds or other Referrals:  None Maternal Substance Abuse:  No Significant Maternal Medications:  None Significant Maternal Lab Results: Group B Strep negative  Results for orders placed or performed during the hospital encounter of 06/04/21 (from the past 24 hour(s))  POCT fern test   Collection Time: 06/04/21  6:33 PM  Result Value Ref Range   POCT Fern Test Positive = ruptured amniotic membanes   Type and screen [facility]   Collection Time: 06/04/21  6:36 PM  Result Value Ref Range   ABO/RH(D) A POS    Antibody Screen NEG    Sample Expiration      06/07/2021,2359 Performed at [facility], [address], [place], [place]     Patient Active Problem List   Diagnosis Date Noted   Indication for care in labor and delivery,  antepartum 06/04/2021   Anemia, antepartum 04/04/2021   History of repair of congenital atrial septal defect (ASD) 11/17/2020   Supervision of normal first pregnancy, antepartum 11/17/2020  Intrauterine pregnancy 11/03/2020    Assessment/Plan:  Bridget Price is a 30 y.o. G2P0010 at [redacted]w[redacted]d here for PPROM. Patient reports feeling "very wet" and having to stop for pads around 1445 on 8/20. Continued with large gushes of fluid this afternoon  #Labor: Lengthy discussion of induction of labor methods with patient and support person. All questions answered. Patient reports low pain tolerance and desires cytotec buccal first and amenable to FB placement at next check if needed #Pain: All options reviewed at length. Per patient request #FWB: Cat 1 #ID:  GBS neg #MOF: Breast #MOC: condoms #Circ:  yes  Rolm Bookbinder, CNM  06/04/2021, 9:29 PM

## 2021-06-04 NOTE — MAU Note (Signed)
Bridget Price is a 30 y.o. at [redacted]w[redacted]d here in MAU reporting: around 67 yesterday noticed a gush of watery fluid, lost her mucus plug today and has noticed some increased leaking. Fluid is clear. Wearing a pad and has changed 3 times today. Saw a little bit of spotting today. Is having left lower abdominal pain that is constant and intermittent right upper abdominal pain. +FM  Onset of complaint: yesterday  Pain score: LLQ 2/10, RUQ 3/10  Vitals:   06/04/21 1712  BP: 111/70  Pulse: 97  Resp: 16  Temp: 98.2 F (36.8 C)  SpO2: 100%     FHT: doppler deferred, pt wearing a jumpsuit, reports +FM  Lab orders placed from triage: none

## 2021-06-04 NOTE — MAU Provider Note (Signed)
History   [id number]   Chief Complaint  Patient presents with   Abdominal Pain   Rupture of Membranes    HPI Bridget Price is a 30 y.o. female  G2P0010 @36 .4 wks here with report of gush of fluid yesterday then lost mucous plug today and had more leaking. She reports +fetal movement. All other systems negative.    Patient's last menstrual period was 09/21/2020.  OB History  Gravida Para Term Preterm AB Living  2       1    SAB IAB Ectopic Multiple Live Births  1            # Outcome Date GA Lbr Len/2nd Weight Sex Delivery Anes PTL Lv  2 Current           1 SAB             Past Medical History:  Diagnosis Date   ASD (atrial septal defect)    Dengue fever 2012   Depression     Family History  Problem Relation Age of Onset   Hypertension Father    Cancer Maternal Aunt    Cancer Maternal Grandmother        melnoma and breast   Cancer Maternal Grandfather        lung CA- smoker   Cancer Paternal Grandmother        melnoma   Hypertension Paternal Grandmother    Cancer Paternal Grandfather        pancreatic    Social History   Socioeconomic History   Marital status: Married    Spouse name: 2013   Number of children: Not on file   Years of education: Not on file   Highest education level: Bachelor's degree (e.g., BA, AB, BS)  Occupational History   Not on file  Tobacco Use   Smoking status: Never   Smokeless tobacco: Never  Vaping Use   Vaping Use: Never used  Substance and Sexual Activity   Alcohol use: Yes    Comment: socially   Drug use: Never   Sexual activity: Yes  Other Topics Concern   Not on file  Social History Narrative   Not on file   Social Determinants of Health   Financial Resource Strain: Not on file  Food Insecurity: Not on file  Transportation Needs: Not on file  Physical Activity: Not on file  Stress: Not on file  Social Connections: Not on file    Allergies  Allergen Reactions   Oxycodone Rash    No current  facility-administered medications on file prior to encounter.   Current Outpatient Medications on File Prior to Encounter  Medication Sig Dispense Refill   ondansetron (ZOFRAN) 4 MG tablet Take 1 tablet (4 mg total) by mouth every 8 (eight) hours as needed for nausea or vomiting. 60 tablet 6   prenatal vitamin w/FE, FA (PRENATAL 1 + 1) 27-1 MG TABS tablet Take 1 tablet by mouth daily at 12 noon.     cyclobenzaprine (FLEXERIL) 10 MG tablet Take 1 tablet (10 mg total) by mouth 3 (three) times daily as needed for muscle spasms. (Patient not taking: Reported on 05/31/2021) 30 tablet 0   famotidine (PEPCID) 20 MG tablet Take 1 tablet (20 mg total) by mouth 2 (two) times daily. (Patient not taking: Reported on 05/31/2021) 60 tablet 3     Review of Systems  Gastrointestinal:  Positive for abdominal pain.  Genitourinary:  Positive for vaginal discharge. Negative for vaginal bleeding.  Physical Exam   Vitals:   06/04/21 1704 06/04/21 1712  BP:  111/70  Pulse:  97  Resp:  16  Temp:  98.2 F (36.8 C)  TempSrc:  Oral  SpO2:  100%  Weight: 69.1 kg   Height: 5\' 2"  (1.575 m)     Physical Exam Constitutional:      General: She is not in acute distress.    Appearance: Normal appearance.  HENT:     Head: Normocephalic and atraumatic.  Pulmonary:     Effort: Pulmonary effort is normal. No respiratory distress.  Genitourinary:    Comments: SSE: +pool, fern pos Musculoskeletal:        General: Normal range of motion.     Cervical back: Normal range of motion.  Skin:    General: Skin is warm and dry.  Neurological:     General: No focal deficit present.     Mental Status: She is alert and oriented to person, place, and time.  Psychiatric:        Mood and Affect: Mood normal.        Behavior: Behavior normal.  EFM: 145 bpm, mod variability, + accels, no decels Toco: rare Vtx by BSUS  Results for orders placed or performed during the hospital encounter of 06/04/21 (from the past 24  hour(s))  POCT fern test     Status: None   Collection Time: 06/04/21  6:33 PM  Result Value Ref Range   POCT Fern Test Positive = ruptured amniotic membanes     MAU Course  Procedures  MDM SROM confirmed. Pan for admit.   Assessment and Plan  [redacted] weeks gestation Reactive NST PROM Admit to LD Mngt per labor team   06/06/21, Donette Larry 06/04/2021 7:07 PM

## 2021-06-05 ENCOUNTER — Inpatient Hospital Stay (HOSPITAL_COMMUNITY): Payer: No Typology Code available for payment source | Admitting: Anesthesiology

## 2021-06-05 LAB — RPR: RPR Ser Ql: NONREACTIVE

## 2021-06-05 MED ORDER — EPHEDRINE 5 MG/ML INJ
10.0000 mg | INTRAVENOUS | Status: DC | PRN
  Administered 2021-06-06: 10 mg via INTRAVENOUS

## 2021-06-05 MED ORDER — LIDOCAINE HCL (PF) 1 % IJ SOLN
INTRAMUSCULAR | Status: DC | PRN
  Administered 2021-06-05: 10 mL via EPIDURAL

## 2021-06-05 MED ORDER — SODIUM CHLORIDE 0.9 % IV SOLN
2.0000 g | Freq: Four times a day (QID) | INTRAVENOUS | Status: DC
  Administered 2021-06-05 (×2): 2 g via INTRAVENOUS
  Filled 2021-06-05 (×2): qty 2000

## 2021-06-05 MED ORDER — DIPHENHYDRAMINE HCL 50 MG/ML IJ SOLN
12.5000 mg | INTRAMUSCULAR | Status: DC | PRN
Start: 2021-06-05 — End: 2021-06-06
  Administered 2021-06-05: 12.5 mg via INTRAVENOUS
  Filled 2021-06-05: qty 1

## 2021-06-05 MED ORDER — FENTANYL-BUPIVACAINE-NACL 0.5-0.125-0.9 MG/250ML-% EP SOLN
12.0000 mL/h | EPIDURAL | Status: DC | PRN
  Administered 2021-06-05 (×2): 12 mL/h via EPIDURAL
  Filled 2021-06-05 (×2): qty 250

## 2021-06-05 MED ORDER — FENTANYL CITRATE (PF) 100 MCG/2ML IJ SOLN
50.0000 ug | Freq: Once | INTRAMUSCULAR | Status: AC
  Administered 2021-06-05: 50 ug via INTRAVENOUS

## 2021-06-05 MED ORDER — EPHEDRINE 5 MG/ML INJ
10.0000 mg | INTRAVENOUS | Status: DC | PRN
  Filled 2021-06-05: qty 5

## 2021-06-05 MED ORDER — GENTAMICIN SULFATE 40 MG/ML IJ SOLN
5.0000 mg/kg | INTRAVENOUS | Status: DC
  Administered 2021-06-05: 350 mg via INTRAVENOUS
  Filled 2021-06-05: qty 8.75

## 2021-06-05 MED ORDER — PHENYLEPHRINE 40 MCG/ML (10ML) SYRINGE FOR IV PUSH (FOR BLOOD PRESSURE SUPPORT)
80.0000 ug | PREFILLED_SYRINGE | INTRAVENOUS | Status: DC | PRN
  Administered 2021-06-06: 80 ug via INTRAVENOUS

## 2021-06-05 MED ORDER — PHENYLEPHRINE 40 MCG/ML (10ML) SYRINGE FOR IV PUSH (FOR BLOOD PRESSURE SUPPORT)
80.0000 ug | PREFILLED_SYRINGE | INTRAVENOUS | Status: AC | PRN
  Administered 2021-06-05 – 2021-06-06 (×3): 80 ug via INTRAVENOUS
  Filled 2021-06-05: qty 10

## 2021-06-05 MED ORDER — LACTATED RINGERS IV SOLN: 500.0000 mL | Freq: Once | INTRAVENOUS | Status: DC

## 2021-06-05 NOTE — Anesthesia Preprocedure Evaluation (Signed)
Anesthesia Evaluation  Patient identified by MRN, date of birth, ID band Patient awake    Reviewed: Allergy & Precautions, H&P , Patient's Chart, lab work & pertinent test results  Airway Mallampati: I       Dental no notable dental hx.    Pulmonary neg pulmonary ROS,    Pulmonary exam normal        Cardiovascular negative cardio ROS Normal cardiovascular exam     Neuro/Psych Depression negative neurological ROS     GI/Hepatic negative GI ROS, Neg liver ROS,   Endo/Other  negative endocrine ROS  Renal/GU negative Renal ROS     Musculoskeletal negative musculoskeletal ROS (+)   Abdominal Normal abdominal exam  (+)   Peds  Hematology  (+) Blood dyscrasia, anemia ,   Anesthesia Other Findings   Reproductive/Obstetrics (+) Pregnancy                             Anesthesia Physical Anesthesia Plan  ASA: 2  Anesthesia Plan: Epidural   Post-op Pain Management:    Induction:   PONV Risk Score and Plan:   Airway Management Planned:   Additional Equipment:   Intra-op Plan:   Post-operative Plan:   Informed Consent: I have reviewed the patients History and Physical, chart, labs and discussed the procedure including the risks, benefits and alternatives for the proposed anesthesia with the patient or authorized representative who has indicated his/her understanding and acceptance.       Plan Discussed with:   Anesthesia Plan Comments:         Anesthesia Quick Evaluation

## 2021-06-05 NOTE — Anesthesia Procedure Notes (Signed)
Epidural Patient location during procedure: OB Start time: 06/05/2021 4:10 AM End time: 06/05/2021 4:14 AM  Staffing Anesthesiologist: Leilani Able, MD Performed: anesthesiologist   Preanesthetic Checklist Completed: patient identified, IV checked, site marked, risks and benefits discussed, surgical consent, monitors and equipment checked, pre-op evaluation and timeout performed  Epidural Patient position: sitting Prep: DuraPrep and site prepped and draped Patient monitoring: continuous pulse ox and blood pressure Approach: midline Location: L3-L4 Injection technique: LOR air  Needle:  Needle type: Tuohy  Needle gauge: 17 G Needle length: 9 cm and 9 Needle insertion depth: 5 cm cm Catheter type: closed end flexible Catheter size: 19 Gauge Catheter at skin depth: 10 cm Test dose: negative and Other  Assessment Events: blood not aspirated, injection not painful, no injection resistance, no paresthesia and negative IV test  Additional Notes Reason for block:procedure for pain

## 2021-06-05 NOTE — Progress Notes (Signed)
Pharmacy Antibiotic Note  Bridget Price is a 30 y.o. female admitted on 06/04/2021 with PPROM at [redacted]w[redacted]d.  Pharmacy has been consulted for Gentamicin dosing for chorioamnionitis/ maternal temp.   Plan: Gentamicin 5mg /kg (350mg ) IV q24h Will continue to follow.    Height: 5\' 2"  (157.5 cm) Weight: 69.1 kg (152 lb 4.8 oz) IBW/kg (Calculated) : 50.1  Temp (24hrs), Avg:98.6 F (37 C), Min:98 F (36.7 C), Max:99.9 F (37.7 C)  Recent Labs  Lab 06/04/21 2246  WBC 11.1*    CrCl cannot be calculated (Patient's most recent lab result is older than the maximum 21 days allowed.).    Allergies  Allergen Reactions   Oxycodone Rash    Antimicrobials this admission: Ampicillin 2 gram IV q6h  8/22>>    Thank you for allowing pharmacy to be a part of this patient's care.  06/05/2021 4:59 PM

## 2021-06-05 NOTE — Progress Notes (Signed)
Patient ID: Bridget Price, female   DOB: 06-Dec-1990, 30 y.o.   MRN: [id number]  Comfortable with epidural.  BP 107/61   Pulse (!) 105   Temp 99.6 F (37.6 C) (Oral)   Resp 17   Ht 5\' 2"  (1.575 m)   Wt 69.1 kg   LMP 09/21/2020   SpO2 100%   BMI 27.86 kg/m   Dilation: 5 Effacement (%): 90 Station: -2 Presentation: Vertex Exam by:: Dr. [email or web address]  IUPC placed.  Continue to titrate pitcoin. Impending fever. Will watch closely.  FHT cat 2 - mild variability. No accel. No decel.  Adrian Blackwater, DO

## 2021-06-05 NOTE — Progress Notes (Signed)
Patient ID: Bridget Price, female   DOB: 1991/05/11, 30 y.o.   MRN: [id number]  Temp increasing. Start antibiotics: Amp/Gent.  Levie Heritage, DO

## 2021-06-05 NOTE — Progress Notes (Signed)
Lucill Mauck is a 30 y.o. G2P0010 at [redacted]w[redacted]d admitted for PPROM  Subjective: Feels less nauseous. Resting between pushes  Objective: BP 119/62   Pulse (!) 118   Temp 100.3 F (37.9 C) (Oral)   Resp 14   Ht 5\' 2"  (1.575 m)   Wt 69.1 kg   LMP 09/21/2020   SpO2 100%   BMI 27.86 kg/m  No intake/output data recorded. Total I/O In: -  Out: 650 [Urine:650]  FHT:  FHR: 152 bpm, variability: moderate,  accelerations:  Present,  decelerations:  Present with contractions but tracing discontinuous UC:   regular, every 2 minutes SVE:   Dilation: 10 Effacement (%): 100 Station: Plus 1 Exam by:: Dr. [email or web address]  Labs: Lab Results  Component Value Date   WBC 11.1 (H) 06/04/2021   HGB 9.8 (L) 06/04/2021   HCT 30.7 (L) 06/04/2021   MCV 86.7 06/04/2021   PLT 145 (L) 06/04/2021    Assessment / Plan: Induction of labor due to PROM,  pushing started at 2130  Labor:  Patient complete at 0 station, pushing to +1. Developed III earlier in the day and receiving abx. Tracing with periods of minimum variability but without decelerations (other than with contractions) and also with low end of moderate reactivity. Labored down for 30 minutes when first complete, now pushing. Coaching for pushing done with bedside RN and ROB RN and now pushing more effectively. Continue pushing  . Will reassess in 30 minutes and continue to monitor for signs of fetal tachycardia and distress/non reassuring tracing. Preeclampsia:   no signs of elevated BP Fetal Wellbeing:  Category II Pain Control:  Epidural I/D:   GBS negative, PROM for >48 hrs, patient tachycardic and febrile, treated with Ampicillin and Gent. Continue treatment till delivery. Anticipated MOD:  NSVD  2131 06/05/2021, 10:44 PM

## 2021-06-05 NOTE — Progress Notes (Signed)
Labor Progress Note Bridget Price is a 30 y.o. G2P0010 at [redacted]w[redacted]d presented for PPROM  S:  Patient comfortable  O:  BP 103/60   Pulse 82   Temp 98.3 F (36.8 C)   Resp 14   Ht 5\' 2"  (1.575 m)   Wt 69.1 kg   LMP 09/21/2020   SpO2 99%   BMI 27.86 kg/m   Fetal Tracing:  Baseline: 130 Variability: moderate Accels: 15x15 Decels: none  Toco: 4-10   CVE: Dilation: 1.5 Effacement (%): 50 Station: -3 Presentation: Vertex Exam by:: [email or web address], CNM   A&P: 30 y.o. G2P0010 [redacted]w[redacted]d PPROM #Labor: Progressing well. Discussed with patient placement of foley balloon for cervical ripening. Risks and benefits reviewed. Patient agreeable to plan of care but desires premedication before placement. Foley balloon inserted without difficulty and inflated with 60cc sterile water. Patient tolerated procedure well. Will continue buccal cytotec #Pain: IV fentanyl #FWB: Cat 1 #GBS negative  [redacted]w[redacted]d, CNM 2:22 AM

## 2021-06-05 NOTE — Progress Notes (Signed)
Labor Progress Note Lavinia Mcneely is a 30 y.o. G2P0010 at [redacted]w[redacted]d presented for PPROM  S:  Comfortable with epidural. FB out  O:  BP (!) 111/54   Pulse 95   Temp 98 F (36.7 C) (Oral)   Resp 12   Ht 5\' 2"  (1.575 m)   Wt 69.1 kg   LMP 09/21/2020   SpO2 100%   BMI 27.86 kg/m   Fetal Tracing:  Baseline: 140 Variability: moderate Accels: 15x15 Decels: none  Toco: 3-5  CVE: Dilation: 5 Effacement (%): 80 Station: -2 Presentation: Vertex Exam by:: [email or web address], CNM   A&P: 30 y.o. G2P0010 [redacted]w[redacted]d PPROM #Labor: Progressing well. Will start pitocin 2x2 #Pain: epidural #FWB: Cat 1 #GBS negative  [redacted]w[redacted]d, CNM 6:20 AM

## 2021-06-05 NOTE — Progress Notes (Signed)
Bridget Price is a 30 y.o. G2P0010 at [redacted]w[redacted]d admitted for PPROM   Subjective: Feeling lots of pressure in back.  Objective: BP 106/69   Pulse (!) 117   Temp 100.3 F (37.9 C) (Oral)   Resp 18   Ht 5\' 2"  (1.575 m)   Wt 69.1 kg   LMP 09/21/2020   SpO2 100%   BMI 27.86 kg/m  No intake/output data recorded. No intake/output data recorded.  FHT:  FHR: 155 bpm, variability: moderate,  accelerations:  Present,  decelerations:  Present variable decelerations  UC:   regular, every 1-2 minutes SVE:   Dilation: 10 Effacement (%): 100 Station: Plus 1 Exam by:: Dr. [email or web address]  Labs: Lab Results  Component Value Date   WBC 11.1 (H) 06/04/2021   HGB 9.8 (L) 06/04/2021   HCT 30.7 (L) 06/04/2021   MCV 86.7 06/04/2021   PLT 145 (L) 06/04/2021    Assessment / Plan: Induction of labor due to PROM,  progressing well on pitocin  Labor:  patient now fully dilated, at 0-+1 station, practice pushing tried, will plan to do side lying release and start pushing in 30 minutes Preeclampsia:   NOne Fetal Wellbeing:   Cat II for variable decelerations, Accels present. Also not tachycardic at this time but at high end of normal, will monitor given maternal fever  Pain Control:  Epidural I/D:   GBS negative, Temp during the day rising and was tachycardic throughout day, received dose of Amp/Gent during day shift, Now temp 100.3, redosing ampicillin now given q6hr dosing    Anticipated MOD:  NSVD  06/06/2021, MD, [facility] OB Fellow, [facility]

## 2021-06-06 ENCOUNTER — Encounter (HOSPITAL_COMMUNITY): Payer: Self-pay | Admitting: Obstetrics and Gynecology

## 2021-06-06 DIAGNOSIS — Z3A36 36 weeks gestation of pregnancy: Secondary | ICD-10-CM

## 2021-06-06 DIAGNOSIS — O4290 Premature rupture of membranes, unspecified as to length of time between rupture and onset of labor, unspecified weeks of gestation: Secondary | ICD-10-CM

## 2021-06-06 DIAGNOSIS — O411230 Chorioamnionitis, third trimester, not applicable or unspecified: Secondary | ICD-10-CM

## 2021-06-06 DIAGNOSIS — O411290 Chorioamnionitis, unspecified trimester, not applicable or unspecified: Secondary | ICD-10-CM

## 2021-06-06 LAB — CBC
HCT: 23.4 % — ABNORMAL LOW (ref 36.0–46.0)
Hemoglobin: 7.4 g/dL — ABNORMAL LOW (ref 12.0–15.0)
MCH: 27.5 pg (ref 26.0–34.0)
MCHC: 31.6 g/dL (ref 30.0–36.0)
MCV: 87 fL (ref 80.0–100.0)
Platelets: 148 10*3/uL — ABNORMAL LOW (ref 150–400)
RBC: 2.69 MIL/uL — ABNORMAL LOW (ref 3.87–5.11)
RDW: 13.2 % (ref 11.5–15.5)
WBC: 19 10*3/uL — ABNORMAL HIGH (ref 4.0–10.5)
nRBC: 0 % (ref 0.0–0.2)

## 2021-06-06 MED ORDER — ONDANSETRON HCL 4 MG/2ML IJ SOLN: 4.0000 mg | INTRAMUSCULAR | Status: DC | PRN

## 2021-06-06 MED ORDER — FAMOTIDINE IN NACL 20-0.9 MG/50ML-% IV SOLN
INTRAVENOUS | Status: AC
  Filled 2021-06-06: qty 50

## 2021-06-06 MED ORDER — TRANEXAMIC ACID-NACL 1000-0.7 MG/100ML-% IV SOLN
INTRAVENOUS | Status: AC
  Administered 2021-06-06: 1000 mg
  Filled 2021-06-06: qty 100

## 2021-06-06 MED ORDER — ACETAMINOPHEN 325 MG PO TABS
650.0000 mg | ORAL_TABLET | ORAL | Status: DC | PRN
  Administered 2021-06-06: 650 mg via ORAL
  Filled 2021-06-06: qty 2

## 2021-06-06 MED ORDER — SENNOSIDES-DOCUSATE SODIUM 8.6-50 MG PO TABS
2.0000 | ORAL_TABLET | Freq: Every day | ORAL | Status: DC
  Administered 2021-06-07: 2 via ORAL
  Filled 2021-06-06: qty 2

## 2021-06-06 MED ORDER — MEASLES, MUMPS & RUBELLA VAC IJ SOLR: 0.5000 mL | Freq: Once | INTRAMUSCULAR | Status: DC

## 2021-06-06 MED ORDER — COCONUT OIL OIL: 1.0000 "application " | TOPICAL_OIL | Status: DC | PRN

## 2021-06-06 MED ORDER — PRENATAL MULTIVITAMIN CH
1.0000 | ORAL_TABLET | Freq: Every day | ORAL | Status: DC
  Administered 2021-06-06 – 2021-06-07 (×2): 1 via ORAL
  Filled 2021-06-06 (×2): qty 1

## 2021-06-06 MED ORDER — DIPHENHYDRAMINE HCL 25 MG PO CAPS: 25.0000 mg | ORAL_CAPSULE | Freq: Four times a day (QID) | ORAL | Status: DC | PRN

## 2021-06-06 MED ORDER — DIBUCAINE (PERIANAL) 1 % EX OINT: 1.0000 "application " | TOPICAL_OINTMENT | CUTANEOUS | Status: DC | PRN

## 2021-06-06 MED ORDER — BENZOCAINE-MENTHOL 20-0.5 % EX AERO
1.0000 "application " | INHALATION_SPRAY | CUTANEOUS | Status: DC | PRN
  Administered 2021-06-06: 1 via TOPICAL
  Filled 2021-06-06: qty 56

## 2021-06-06 MED ORDER — SODIUM CHLORIDE 0.9 % IV SOLN: 6.2500 mg | Freq: Once | INTRAVENOUS | Status: DC

## 2021-06-06 MED ORDER — AMMONIA AROMATIC IN INHA
RESPIRATORY_TRACT | Status: AC
  Filled 2021-06-06: qty 10

## 2021-06-06 MED ORDER — SIMETHICONE 80 MG PO CHEW: 80.0000 mg | CHEWABLE_TABLET | ORAL | Status: DC | PRN

## 2021-06-06 MED ORDER — TRANEXAMIC ACID-NACL 1000-0.7 MG/100ML-% IV SOLN: 1000.0000 mg | INTRAVENOUS | Status: DC

## 2021-06-06 MED ORDER — WITCH HAZEL-GLYCERIN EX PADS: 1.0000 "application " | MEDICATED_PAD | CUTANEOUS | Status: DC | PRN

## 2021-06-06 MED ORDER — TETANUS-DIPHTH-ACELL PERTUSSIS 5-2.5-18.5 LF-MCG/0.5 IM SUSY
0.5000 mL | PREFILLED_SYRINGE | Freq: Once | INTRAMUSCULAR | Status: AC
  Administered 2021-06-07: 0.5 mL via INTRAMUSCULAR
  Filled 2021-06-06: qty 0.5

## 2021-06-06 MED ORDER — METOCLOPRAMIDE HCL 5 MG/ML IJ SOLN: 5.0000 mg | Freq: Once | INTRAMUSCULAR | Status: DC

## 2021-06-06 MED ORDER — MEDROXYPROGESTERONE ACETATE 150 MG/ML IM SUSP: 150.0000 mg | INTRAMUSCULAR | Status: DC | PRN

## 2021-06-06 MED ORDER — SODIUM CHLORIDE 0.9 % IV SOLN
500.0000 mg | Freq: Once | INTRAVENOUS | Status: AC
  Administered 2021-06-06: 500 mg via INTRAVENOUS
  Filled 2021-06-06: qty 25

## 2021-06-06 MED ORDER — FAMOTIDINE IN NACL 20-0.9 MG/50ML-% IV SOLN
20.0000 mg | Freq: Once | INTRAVENOUS | Status: AC
  Administered 2021-06-06: 20 mg via INTRAVENOUS

## 2021-06-06 MED ORDER — ONDANSETRON HCL 4 MG PO TABS: 4.0000 mg | ORAL_TABLET | ORAL | Status: DC | PRN

## 2021-06-06 MED ORDER — IBUPROFEN 600 MG PO TABS
600.0000 mg | ORAL_TABLET | Freq: Four times a day (QID) | ORAL | Status: DC
  Administered 2021-06-06 – 2021-06-07 (×7): 600 mg via ORAL
  Filled 2021-06-06 (×7): qty 1

## 2021-06-06 NOTE — Lactation Note (Addendum)
This note was copied from a baby's chart. Lactation Consultation Note  Patient Name: Bridget Price Date: 06/06/2021 Reason for consult: Initial assessment;1st time breastfeeding;Primapara;Late-preterm 34-36.6wks Age:30 hours According to doc flow sheets baby last fed 3 1/2 hours ago.  LC reviewed doc flow sheets - attempts, spoon fed x 2 , 1 wet and 2 stools.  Baby Sleepy even after diaper change, LC woke him up to spoon feed .  Baby STS with mom. Latch score 4  LC reviewed the potential feeding behaviors of LPT infant and mentioned to mom later this shift a DEBP would be set up.    LC provided the [facility] brochure with resources.  Maternal Data - per mom several breast changes.  Has patient been taught Hand Expression?: Yes  Feeding Mother's Current Feeding Choice: Breast Milk  LATCH Score  Latch: Too sleepy or reluctant, no latch achieved, no sucking elicited.  Audible Swallowing: None  Type of Nipple: Everted at rest and after stimulation  Comfort (Breast/Nipple): Soft / non-tender  Hold (Positioning): Full assist, staff holds infant at breast  LATCH Score: 4   Lactation Tools Discussed/Used Tools:  (LC mentioned to mom due to baby being a LPT - will set up a DEBP later today)  Interventions Interventions: Breast feeding basics reviewed;Assisted with latch;Skin to skin;Hand express;Adjust position;Education  Discharge    Consult Status Consult Status: Follow-up Date: 06/06/21 Follow-up type: In-patient    Bridget Price 06/06/2021, 8:52 AM

## 2021-06-06 NOTE — Lactation Note (Signed)
This note was copied from a baby's chart. Lactation Consultation Note  Patient Name: Bridget Price Bridget Price Date: 06/06/2021 Reason for consult: L&D Initial assessment;1st time breastfeeding;Late-preterm 34-36.6wks Age:30 hours Mom was very tired and sleepy when LC entered room.  Mom attempted to latch infant on her right breast, infant only held breast in mouth and would not suckle at the breast. Mom was taught hand expression and infant was given 7 mls of EBM by spoon. Afterwards dad did skin to skin with infant as LC left the room. Mom was sleeping. LC will alert MBU ( RN)  that mom would benefit from using a DEBP due infant being LPTI . Mom knows to breastfeed infant according to primal cues: licking , kissing, tasting, hands and fist in mouth, skin to skin. Mom knows if infant doesn't latch to ask  for latch assistance from RN or LC and mom can hand express and give infant back EBM by spoon.  Maternal Data Has patient been taught Hand Expression?: Yes Does the patient have breastfeeding experience prior to this delivery?: No  Feeding Mother's Current Feeding Choice: Breast Milk  LATCH Score Latch: Too sleepy or reluctant, no latch achieved, no sucking elicited.  Audible Swallowing: None  Type of Nipple: Everted at rest and after stimulation  Comfort (Breast/Nipple): Soft / non-tender  Hold (Positioning): Assistance needed to correctly position infant at breast and maintain latch.  LATCH Score: 5   Lactation Tools Discussed/Used    Interventions Interventions: Breast feeding basics reviewed;Assisted with latch;Skin to skin;Hand express;Position options;Support pillows;Adjust position;Breast compression  Discharge WIC Program: No  Consult Status Consult Status: Follow-up Date: 06/06/21 Follow-up type: In-patient    Danelle Earthly 06/06/2021, 2:21 AM

## 2021-06-06 NOTE — Social Work (Signed)
MOB was referred for history of depression.   * Referral screened out by Clinical Social Worker because none of the following criteria appear to apply:  ~ History of anxiety/depression during this pregnancy, or of post-partum depression following prior delivery. No prenatal concerns noted. ~ Diagnosis of anxiety and/or depression within last 3 years.  OR * MOB's symptoms currently being treated with medication and/or therapy.  Please contact the Clinical Social Worker if needs arise, by MOB request, or if MOB scores greater than 9/yes to question 10 on Edinburgh Postpartum Depression Screen.  Elray Dains, LCSWA Clinical Social Work [facility]  [phone]  

## 2021-06-06 NOTE — Plan of Care (Signed)

## 2021-06-06 NOTE — Discharge Summary (Addendum)
Postpartum Discharge Summary   Patient Name: Bridget Price DOB: 08/07/1991 MRN: [id number]  Date of admission: 06/04/2021 Delivery date:06/06/2021  Delivering provider: Renard Matter  Date of discharge: 06/07/2021  Admitting diagnosis: Indication for care in labor and delivery, antepartum [O75.9] Intrauterine pregnancy: [redacted]w[redacted]d     Secondary diagnosis:  Active Problems:   History of repair of congenital atrial septal defect (ASD)   Supervision of normal first pregnancy, antepartum   Anemia affecting pregnancy   Indication for care in labor and delivery, antepartum   Vaginal delivery   Chorioamnionitis during labor in setting of prolonged rupture  Additional problems: None    Discharge diagnosis: Term Pregnancy Delivered and Anemia                                              Post partum procedures: None Augmentation: Pitocin, Cytotec, and IP Foley Complications: Intrauterine Inflammation or infection (Chorioamniotis) and ROM>24 hours  Hospital course: Induction of Labor With Vaginal Delivery   30 y.o. yo G2P0010 at 106w6d was admitted to the hospital 06/04/2021 for induction of labor.  Indication for induction: PROM.  Patient had an uncomplicated labor course as follows: Membrane Rupture Time/Date: 2:45 PM ,06/03/2021   Delivery Method:Vaginal, Spontaneous  Episiotomy: None  Lacerations:  2nd degree;Perineal  Details of delivery can be found in separate delivery note. Labor complicated by triple I which was treated and resolved post-partum. Patient had a routine postpartum course. Patient is discharged home 06/07/21.  Newborn Data: Birth date:06/06/2021  Birth time:12:21 AM  Gender:Female  Living status:Living  Apgars:8 ,9  Weight:3184 g   Magnesium Sulfate received: No BMZ received: No Rhophylac:N/A MMR:N/A T-DaP: Offered post partum Flu: N/A Transfusion: IV iron Yes  Physical exam  Vitals:   06/06/21 1150 06/06/21 1642 06/06/21 2100 06/07/21 0627  BP: (!) 102/52 107/62  (!) 102/58 106/65  Pulse: 98 96 93 65  Resp: $Remo'18 18 18 18  'UylBP$ Temp: 97.9 F (36.6 C) 97.9 F (36.6 C) 97.8 F (36.6 C) 98 F (36.7 C)  TempSrc: Oral Oral Oral   SpO2: 97% 98% 99% 100%  Weight:      Height:       General: alert Lochia: appropriate Uterine Fundus: firm Incision: Healing well with no significant drainage DVT Evaluation: No evidence of DVT seen on physical exam. Labs: Lab Results  Component Value Date   WBC 19.0 (H) 06/06/2021   HGB 7.4 (L) 06/06/2021   HCT 23.4 (L) 06/06/2021   MCV 87.0 06/06/2021   PLT 148 (L) 06/06/2021   CMP Latest Ref Rng & Units 03/22/2021  Glucose 65 - 99 mg/dL 91  BUN 6 - 20 mg/dL 9  Creatinine 0.57 - 1.00 mg/dL 0.55(L)  Sodium 134 - 144 mmol/L 140  Potassium 3.5 - 5.2 mmol/L 3.8  Chloride 96 - 106 mmol/L 103  CO2 20 - 29 mmol/L 23  Calcium 8.7 - 10.2 mg/dL 8.9  Total Protein 6.0 - 8.5 g/dL 5.9(L)  Total Bilirubin 0.0 - 1.2 mg/dL 0.2  Alkaline Phos 44 - 121 IU/L 50  AST 0 - 40 IU/L 19  ALT 0 - 32 IU/L 25   Edinburgh Score: Edinburgh Postnatal Depression Scale Screening Tool 06/06/2021  I have been able to laugh and see the funny side of things. 0  I have looked forward with enjoyment to things. 0  I have blamed  myself unnecessarily when things went wrong. 2  I have been anxious or worried for no good reason. 2  I have felt scared or panicky for no good reason. 0  Things have been getting on top of me. 1  I have been so unhappy that I have had difficulty sleeping. 1  I have felt sad or miserable. 1  I have been so unhappy that I have been crying. 1  The thought of harming myself has occurred to me. 0  Edinburgh Postnatal Depression Scale Total 8     After visit meds:  Allergies as of 06/07/2021       Reactions   Oxycodone Rash        Medication List     STOP taking these medications    cyclobenzaprine 10 MG tablet Commonly known as: FLEXERIL   famotidine 20 MG tablet Commonly known as: PEPCID   ondansetron 4 MG  tablet Commonly known as: Zofran       TAKE these medications    acetaminophen 325 MG tablet Commonly known as: Tylenol Take 2 tablets (650 mg total) by mouth every 4 (four) hours as needed (for pain scale < 4).   ferrous sulfate 325 (65 FE) MG tablet Take 1 tablet (325 mg total) by mouth every other day.   ibuprofen 600 MG tablet Commonly known as: ADVIL Take 1 tablet (600 mg total) by mouth every 6 (six) hours.   prenatal vitamin w/FE, FA 27-1 MG Tabs tablet Take 1 tablet by mouth daily at 12 noon.         Discharge home in stable condition Infant Feeding: Breast Infant Disposition:home with mother Discharge instruction: per After Visit Summary and Postpartum booklet. Activity: Advance as tolerated. Pelvic rest for 6 weeks.  Diet: routine diet Future Appointments: Future Appointments  Date Time Provider [place]  07/06/2021  3:50 PM Truett Mainland, DO [facility] None   Follow up Visit: Message sent to [facility] by Dr.Das on 8/23 Please schedule this patient for a In person postpartum visit in 4 weeks with the following provider: MD. Additional Postpartum F/U: None   High risk pregnancy complicated by:  history of ASD and repair Delivery mode:  Vaginal, Spontaneous  Anticipated Birth Control:  Condoms  Renard Matter, MD, MPH OB Fellow, Faculty Practice

## 2021-06-06 NOTE — Anesthesia Postprocedure Evaluation (Signed)
Anesthesia Post Note  Patient: Bridget Price  Procedure(s) Performed: AN AD HOC LABOR EPIDURAL     Patient location during evaluation: Mother Baby Anesthesia Type: Epidural Level of consciousness: awake and alert Pain management: pain level controlled Vital Signs Assessment: post-procedure vital signs reviewed and stable Respiratory status: spontaneous breathing, nonlabored ventilation and respiratory function stable Cardiovascular status: stable Postop Assessment: no headache, no backache and epidural receding Anesthetic complications: no   No notable events documented.  Last Vitals:  Vitals:   06/06/21 0401 06/06/21 0450  BP: 103/62 109/62  Pulse: (!) 106 (!) 110  Resp: 18 20  Temp: 36.8 C 36.8 C  SpO2: 99%     Last Pain:  Vitals:   06/06/21 0720  TempSrc:   PainSc: 0-No pain   Pain Goal:                   [name]

## 2021-06-06 NOTE — Lactation Note (Signed)
This note was copied from a baby's chart. Lactation Consultation Note  Patient Name: Bridget Price [id number] Date: 06/06/2021   Age:30 hours RN in L&D will call LC on Vocera when Mom is ready for latch assistance, mom is currently having a repair done.  Maternal Data    Feeding    LATCH Score                    Lactation Tools Discussed/Used    Interventions    Discharge    Consult Status      Danelle Earthly 06/06/2021, 12:41 AM

## 2021-06-07 ENCOUNTER — Ambulatory Visit: Payer: Self-pay

## 2021-06-07 ENCOUNTER — Encounter: Payer: No Typology Code available for payment source | Admitting: Family Medicine

## 2021-06-07 MED ORDER — FERROUS SULFATE 325 (65 FE) MG PO TABS: 325.0000 mg | ORAL_TABLET | ORAL | 0 refills | Status: DC

## 2021-06-07 MED ORDER — IBUPROFEN 600 MG PO TABS: 600.0000 mg | ORAL_TABLET | Freq: Four times a day (QID) | ORAL | 0 refills | Status: DC

## 2021-06-07 MED ORDER — ACETAMINOPHEN 325 MG PO TABS: 650.0000 mg | ORAL_TABLET | ORAL | 0 refills | Status: DC | PRN

## 2021-06-07 NOTE — Lactation Note (Signed)
This note was copied from a baby's chart. Lactation Consultation Note  Patient Name: Bridget Price REVQW'Q Date: 06/07/2021 Reason for consult: Follow-up assessment;Late-preterm 34-36.6wks;1st time breastfeeding;Primapara Age:30  LC met with P1 Mom of LPTI at 5.5% weight loss.   Baby has been supplemented with donor milk and now 22 cal formula by paced bottle using Dr. Jarrett Soho premie per SLP consultation.  Pump set up at bedside and Mom states she has been pumping.  Reviewed routine and importance of disassembling pump parts, washing, rinsing and air drying in separate bin provided.  Parts were sitting on edge of sink.    Baby had just fed 20 ml of 22 cal formula by FOB.   Mom provided a hand's on review of positioning baby in cross cradle hold as Mom had stated that baby latched to her nipple.  LC guided Mom's hands to support and sandwich in a U hold with a pillow placed for baby at breast height.   Reviewed breast massage and hand expression- colostrum drops expressed.   Plan- 1- Keep baby STS as much as possible 2- Offer breast with cues, or awaken baby at 3 hrs if sleeping. 3- Supplement after breastfeeding per volume guidelines 20-30 ml by paced bottle 4- hand express and double pump whenever baby is supplemented using initiation setting. 5- ask for help prn with latching baby.   Lactation Tools Discussed/Used Tools: Pump;Bottle Breast pump type: Double-Electric Breast Pump Pump Education: Setup, frequency, and cleaning  Interventions Interventions: Breast feeding basics reviewed;Skin to skin;Breast massage;Hand express;Support pillows;DEBP;Education;Pace feeding  Discharge Pump: Personal;Refer for rental ([employer]) WIC Program: No  Consult Status Consult Status: Follow-up Date: 06/08/21 Follow-up type: In-patient    Bridget Price 06/07/2021, 6:58 PM

## 2021-06-08 ENCOUNTER — Ambulatory Visit: Payer: Self-pay

## 2021-06-08 NOTE — Lactation Note (Signed)
This note was copied from a baby's chart. Lactation Consultation Note  Patient Name: Bridget Price ASTMH'D Date: 06/08/2021 Reason for consult: Follow-up assessment;Hyperbilirubinemia;Late-preterm 34-36.6wks;1st time breastfeeding;Primapara Age:30 hours  LC in to check on P1 Mom and FOB.  FOB lying on couch in sunny window with baby STS on his chest.   Baby's bilirubin is high intermediate risk and weight loss is at 6.2%.    Mom states baby has been sleepy and having full feedings by bottle currently.  Mom pumping with 24 mm flanges and nipples pulled well into flange and rubbing.  LC changed flange size to 27 mm and noted Mom's nipples are blistered at ends.  Pump strength turned down from full to 1/3 strength and Mom states she feels more comfortable.  Comfort gels provided with instructions.  Encouraged STS and offering breast with cues.  Recommended feeding baby at 3 hrs or sooner if he is cueing he is hungry.  Volumes of EBM increasing and Mom to offer her EBM first before adding formula to volume.  Baby should be taking 20-30 ml per feeding now.  FOB went to gift shop to  inquire about a DEBP rental.  [employer] closed.  Stork pump order placed.  Mom to call out prn for guidance.  Baby's afternoon bilirubin will determine discharge.   Lactation Tools Discussed/Used Tools: Pump;Flanges;Bottle Flange Size: 27 Breast pump type: Double-Electric Breast Pump Pumping frequency: Q 3 hrs Pumped volume: 25 mL  Interventions Interventions: Breast feeding basics reviewed;Skin to skin;Breast massage;Hand express;DEBP;Education;Pace feeding Comfort Gels  Discharge Pump: Stork Pump  Consult Status Consult Status: Follow-up Date: 06/09/21 Follow-up type: In-patient    [name] 06/08/2021, 12:13 PM

## 2021-06-14 ENCOUNTER — Encounter: Payer: No Typology Code available for payment source | Admitting: Family Medicine

## 2021-06-16 ENCOUNTER — Telehealth (HOSPITAL_COMMUNITY): Payer: Self-pay | Admitting: *Deleted

## 2021-06-16 NOTE — Telephone Encounter (Signed)
Mom reports feeling good. No concerns about herself. EPDS=6 [facility] score = 8) Mom reports baby is well. Gaining weight. Feeding, peeing, and pooping without difficulty. No concerns about baby.  Duffy Rhody, RN 06-16-2021 at 2:31pm

## 2021-06-22 ENCOUNTER — Encounter: Payer: No Typology Code available for payment source | Admitting: Family Medicine

## 2021-06-29 ENCOUNTER — Encounter: Payer: No Typology Code available for payment source | Admitting: Family Medicine

## 2021-07-06 ENCOUNTER — Ambulatory Visit (INDEPENDENT_AMBULATORY_CARE_PROVIDER_SITE_OTHER): Payer: No Typology Code available for payment source | Admitting: Family Medicine

## 2021-07-06 ENCOUNTER — Other Ambulatory Visit: Payer: Self-pay

## 2021-07-06 ENCOUNTER — Encounter: Payer: Self-pay | Admitting: Family Medicine

## 2021-07-06 NOTE — Progress Notes (Signed)
Post Partum Visit Note  Bridget Price is a 30 y.o. [phone] female who presents for a postpartum visit. She is 4 weeks postpartum following a normal spontaneous vaginal delivery.  I have fully reviewed the prenatal and intrapartum course. The delivery was at 36.6 gestational weeks.  Anesthesia: epidural. Postpartum course has been uneventful. Baby is doing well. Baby is feeding by breast and bottle . Bleeding thin lochia. Bowel function is normal. Bladder function is normal. Patient is not sexually active. Contraception method is condoms. Postpartum depression screening: negative.   The pregnancy intention screening data noted above was reviewed. Potential methods of contraception were discussed. The patient elected to proceed with No data recorded.   Edinburgh Postnatal Depression Scale - 07/06/21 1606       Edinburgh Postnatal Depression Scale:  In the Past 7 Days   I have been able to laugh and see the funny side of things. 0    I have looked forward with enjoyment to things. 1    I have blamed myself unnecessarily when things went wrong. 2    I have been anxious or worried for no good reason. 1    I have felt scared or panicky for no good reason. 0    Things have been getting on top of me. 0    I have been so unhappy that I have had difficulty sleeping. 0    I have felt sad or miserable. 0    I have been so unhappy that I have been crying. 0    The thought of harming myself has occurred to me. 0    Edinburgh Postnatal Depression Scale Total 4             Health Maintenance Due  Topic Date Due   PAP SMEAR-Modifier  Never done   INFLUENZA VACCINE  05/15/2021    The following portions of the patient's history were reviewed and updated as appropriate: allergies, current medications, past family history, past medical history, past social history, past surgical history, and problem list.  Review of Systems Pertinent items are noted in HPI.  Objective:  BP 106/72    Pulse 66   Ht 5\' 2"  (1.575 m)   Wt 128 lb (58.1 kg)   LMP 09/21/2020   Breastfeeding Yes Comment: Bottle feeding  BMI 23.41 kg/m    General:  alert, cooperative, and no distress   Breasts:  not indicated  Lungs: clear to auscultation bilaterally  Heart:  regular rate and rhythm, S1, S2 normal, no murmur, click, rub or gallop  Abdomen: soft, non-tender; bowel sounds normal; no masses,  no organomegaly   Wound N/a  GU exam:  not indicated       Assessment:   1. Postpartum exam      Plan:   Essential components of care per ACOG recommendations:  1.  Mood and well being: Patient with negative depression screening today. Reviewed local resources for support.  - Patient tobacco use? No.   - hx of drug use? No.    2. Infant care and feeding:  -Patient currently breastmilk feeding? Yes. Reviewed importance of draining breast regularly to support lactation.  -Social determinants of health (SDOH) reviewed in EPIC. No concerns  3. Sexuality, contraception and birth spacing - Patient does not want a pregnancy in the next year.    - Reviewed forms of contraception in tiered fashion. Patient desired condoms today.   - Discussed birth spacing of 18 months  4.  Sleep and fatigue -Encouraged family/partner/community support of 4 hrs of uninterrupted sleep to help with mood and fatigue  5. Physical Recovery  - Discussed patients delivery and complications. She describes her labor as good. - Patient had a Vaginal, no problems at delivery. Patient had a 2nd degree laceration. Perineal healing reviewed. Patient expressed understanding - Patient has urinary incontinence? No. - Patient is safe to resume physical and sexual activity  6.  Health Maintenance - HM due items addressed Yes - Last pap smear No results found for: DIAGPAP Pap smear not done at today's visit.  -Breast Cancer screening indicated? No.   7. Chronic Disease/Pregnancy Condition follow up: None  - PCP follow  up  Levie Heritage, DO [facility] for [facility], [facility]

## 2021-09-21 ENCOUNTER — Telehealth: Payer: Self-pay

## 2021-09-21 MED ORDER — SERTRALINE HCL 50 MG PO TABS: 50.0000 mg | ORAL_TABLET | Freq: Every day | ORAL | 3 refills | Status: DC

## 2021-09-21 NOTE — Telephone Encounter (Signed)
Patient called and is 4 months postpartum, Patient has been dealing with depression, anxiety and some anger. Patient has been seeking care with Percell Belt at [facility] in [place] [phone])  Patient has never taken medication for these but her therapist is suggesting she may benefit from some.   Patient denies any desire to do harm to herself or her baby. Will route to provider for input. Armandina Stammer RN

## 2021-09-21 NOTE — Telephone Encounter (Signed)
Recommend starting sertraline if patient is amenable, rx sent. Continue with behavioral health, emphasize we can refer her to other resources for this if she desires.

## 2021-09-22 NOTE — Telephone Encounter (Signed)
Patient returned call to office and made follow up appointment. Armandina Stammer RN

## 2021-09-22 NOTE — Telephone Encounter (Signed)
Called patient and left message for her to return call to office.   - need to make her aware of script  - also schedule medication follow up with Dr. Theodoro Parma

## 2021-09-26 ENCOUNTER — Encounter: Payer: Self-pay | Admitting: Family Medicine

## 2021-10-26 ENCOUNTER — Encounter: Payer: Self-pay | Admitting: Obstetrics and Gynecology

## 2021-10-26 ENCOUNTER — Ambulatory Visit: Payer: No Typology Code available for payment source | Admitting: Family Medicine

## 2021-10-26 ENCOUNTER — Telehealth (INDEPENDENT_AMBULATORY_CARE_PROVIDER_SITE_OTHER): Payer: No Typology Code available for payment source | Admitting: Obstetrics and Gynecology

## 2021-10-26 DIAGNOSIS — Z79899 Other long term (current) drug therapy: Secondary | ICD-10-CM | POA: Diagnosis not present

## 2021-10-26 NOTE — Progress Notes (Signed)
Patient reports doing well on the zoloft. Kathrene Alu RN

## 2021-10-26 NOTE — Progress Notes (Signed)
° ° °  GYNECOLOGY VIRTUAL VISIT ENCOUNTER NOTE  Provider location: Center for [facility]   Patient location: Home  I connected with Bridget Price on 10/26/21 at 11:15 AM EST by MyChart Video Encounter and verified that I am speaking with the correct person using two identifiers.   I discussed the limitations, risks, security and privacy concerns of performing an evaluation and management service virtually and the availability of in person appointments. I also discussed with the patient that there may be a patient responsible charge related to this service. The patient expressed understanding and agreed to proceed.   History:  Bridget Price is a 31 y.o. [phone] female being evaluated today for follow up on depression/anxiety. Patient is now 5 months pp and reports significant improvement in her mental health with zoloft. She is extremely pleased with the results and plans to continue until at least the summer months and will consider discontinuing at that time. Patient is without any other complaints. . She denies any abnormal vaginal discharge, bleeding, pelvic pain or other concerns.       Past Medical History:  Diagnosis Date   ASD (atrial septal defect)    Dengue fever 2012   Depression    Past Surgical History:  Procedure Laterality Date   ASD REPAIR  2015   ASD repair  2015   TONSILLECTOMY     TONSILLECTOMY     The following portions of the patient's history were reviewed and updated as appropriate: allergies, current medications, past family history, past medical history, past social history, past surgical history and problem list.   Health Maintenance:  Due for pap smear  Review of Systems:  Pertinent items noted in HPI and remainder of comprehensive ROS otherwise negative.  Physical Exam:   General:  Alert, oriented and cooperative. Patient appears to be in no acute distress.  Mental Status: Normal mood and affect. Normal behavior. Normal  judgment and thought content.   Respiratory: Normal respiratory effort, no problems with respiration noted  Rest of physical exam deferred due to type of encounter  Labs and Imaging No results found for this or any previous visit (from the past 336 hour(s)). No results found.     Assessment and Plan:     Follow up on depression/anxiety.     Patient is doing well on current dose of Zoloft RTC next available for physical exam with pap smear I discussed the assessment and treatment plan with the patient. The patient was provided an opportunity to ask questions and all were answered. The patient agreed with the plan and demonstrated an understanding of the instructions.   The patient was advised to call back or seek an in-person evaluation/go to the ED if the symptoms worsen or if the condition fails to improve as anticipated.  I provided 10 minutes of face-to-face time during this encounter.   Catalina Antigua, MD Center for [employer], [facility]

## 2022-05-03 ENCOUNTER — Encounter: Payer: Self-pay | Admitting: General Practice

## 2022-10-15 DIAGNOSIS — S329XXA Fracture of unspecified parts of lumbosacral spine and pelvis, initial encounter for closed fracture: Secondary | ICD-10-CM

## 2022-10-15 HISTORY — DX: Fracture of unspecified parts of lumbosacral spine and pelvis, initial encounter for closed fracture: S32.9XXA

## 2022-12-19 IMAGING — US US OB < 14 WEEKS - US OB TV
1 series · 15 of 28 positions shown · non-contrast
Comparison: None.

CLINICAL DATA: Cramps. Estimated gestational age by last menstrual
period equals 6 weeks 1 day

EXAM:
OBSTETRIC <14 WK US AND TRANSVAGINAL OB US
TECHNIQUE: Both transabdominal and transvaginal ultrasound examinations were
performed for complete evaluation of the gestation as well as the
maternal uterus, adnexal regions, and pelvic cul-de-sac.
Transvaginal technique was performed to assess early pregnancy.

[Series 1: us ob < 14 weeks - us ob tv · 15 of 53 slices shown]
[im 1/53]
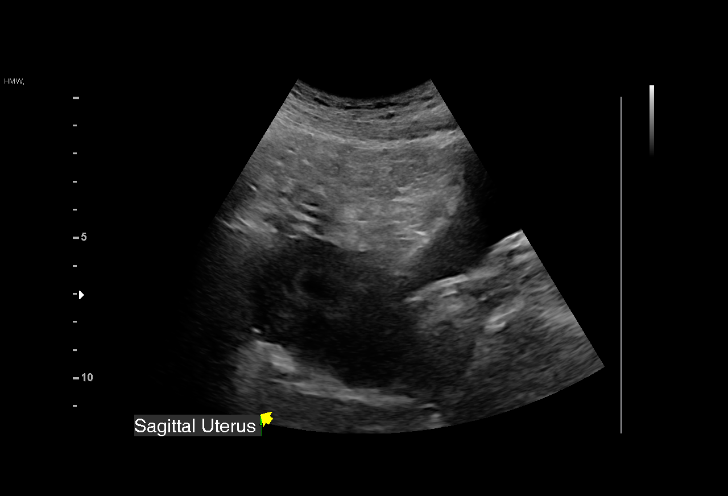
[im 4/53]
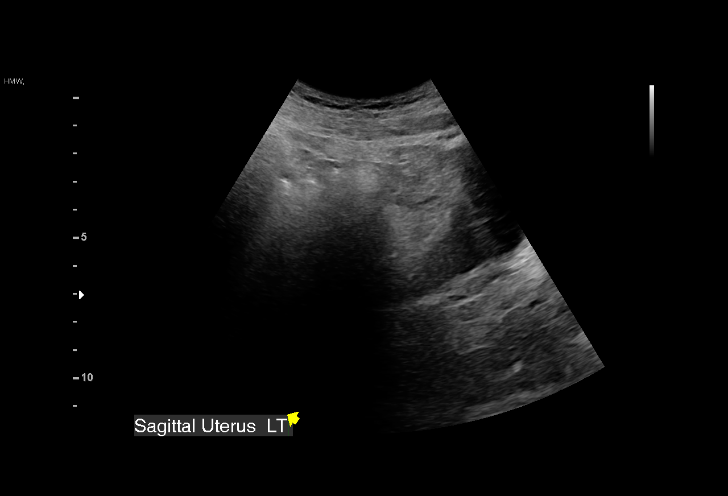
[im 8/53]
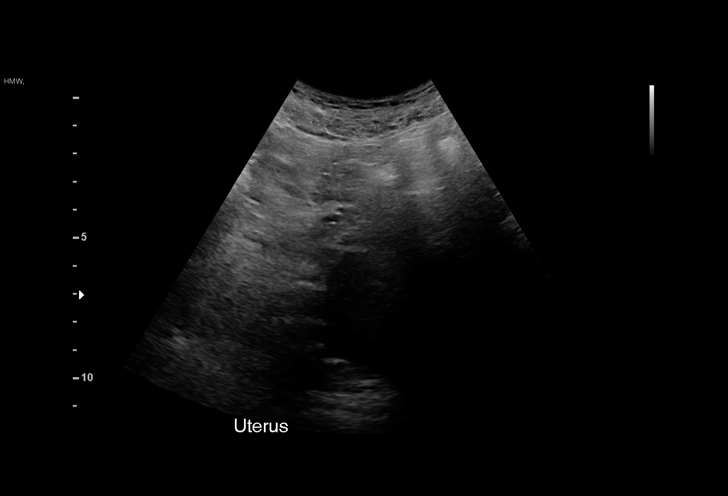
[im 12/53]
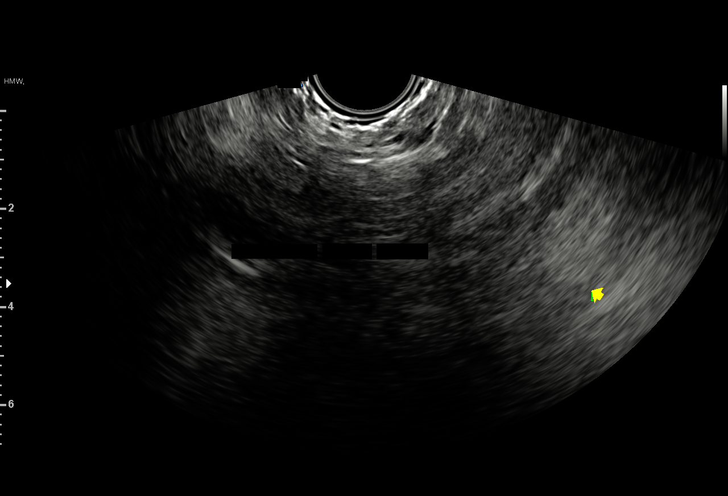
[im 16/53]
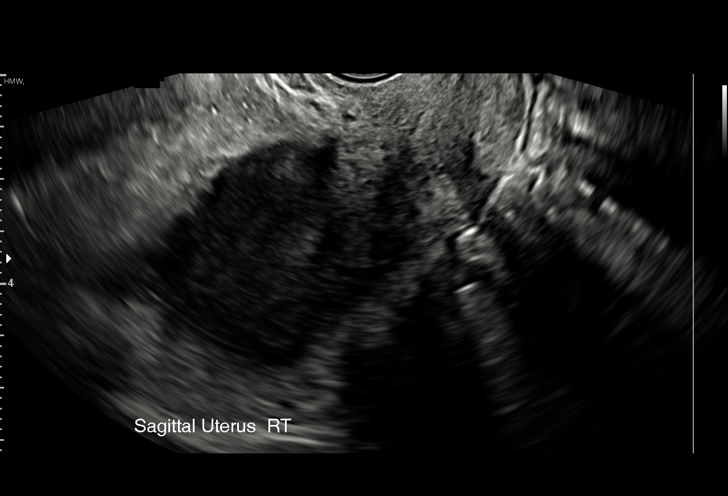
[im 20/53]
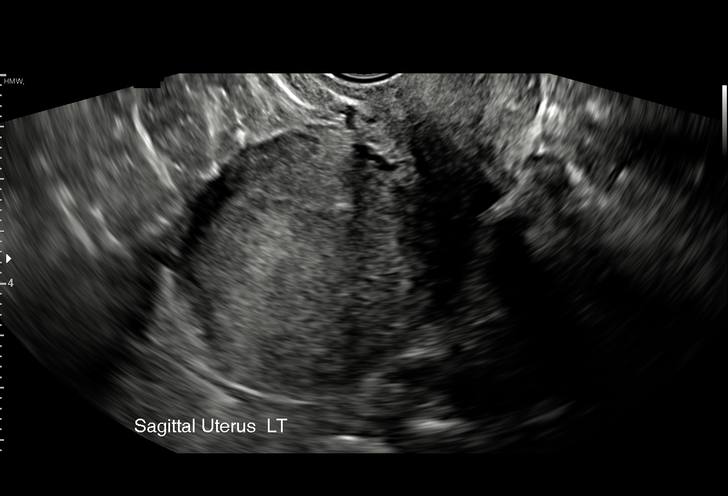
[im 24/53]
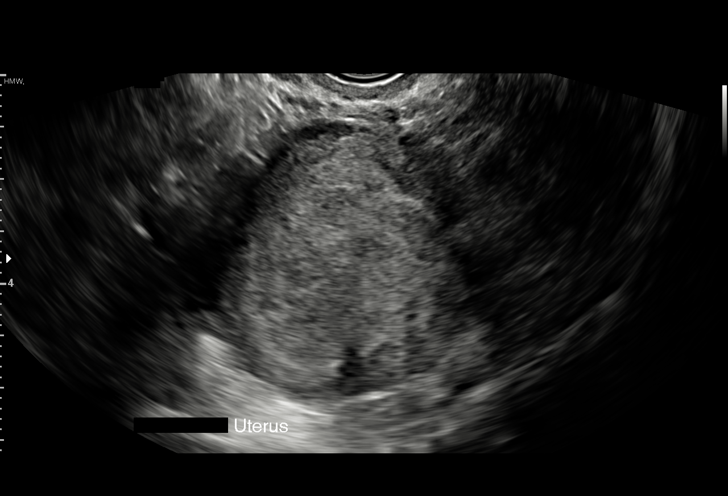
[im 27/53]
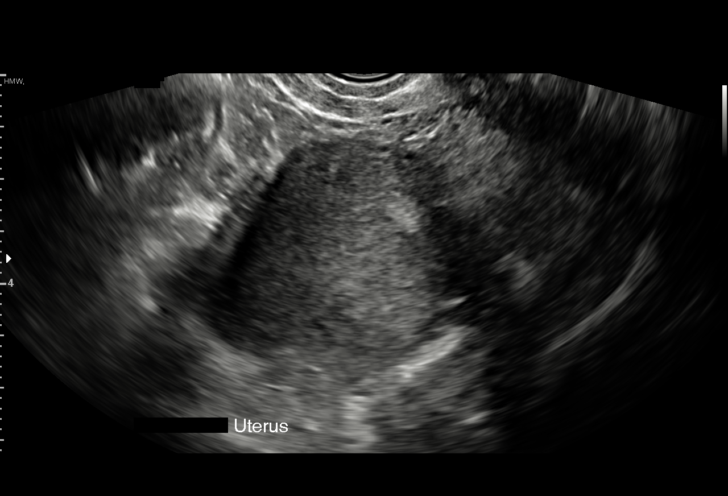
[im 29/53]
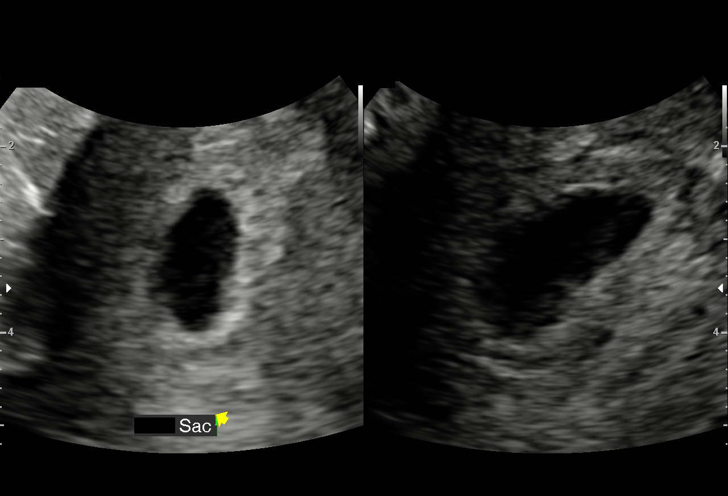
[im 33/53]
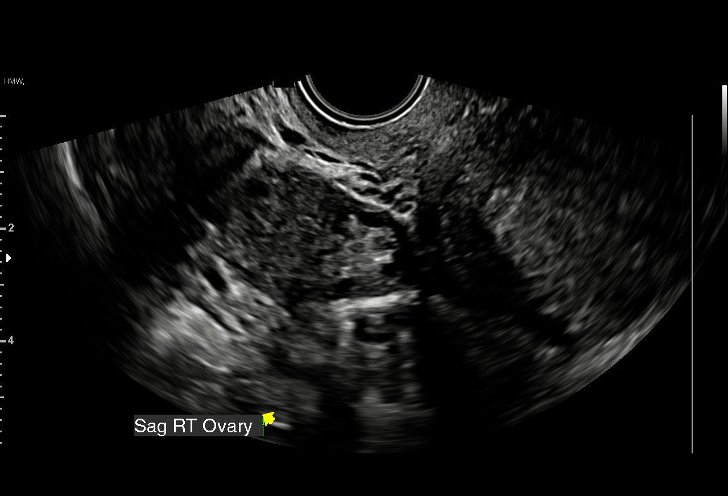
[im 37/53]
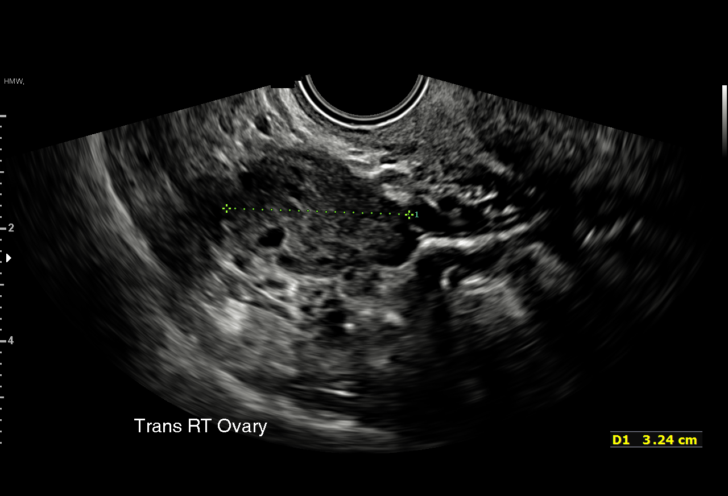
[im 41/53]
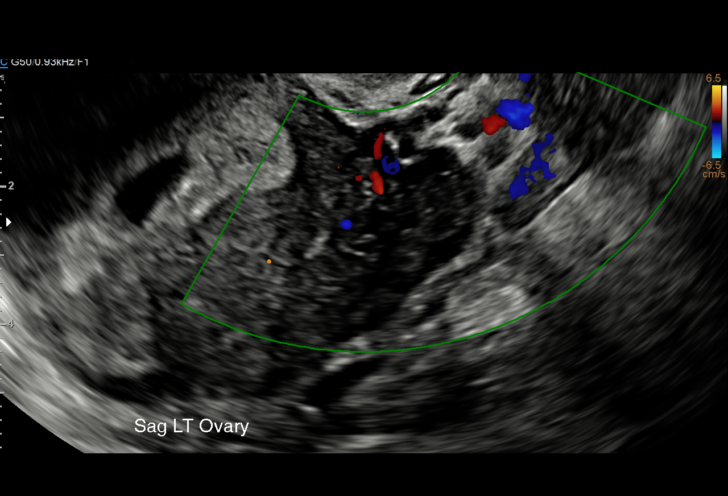
[im 45/53]
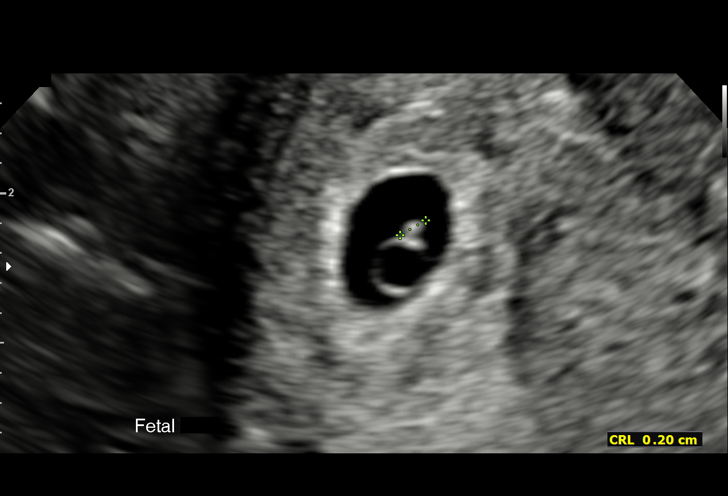
[im 49/53]
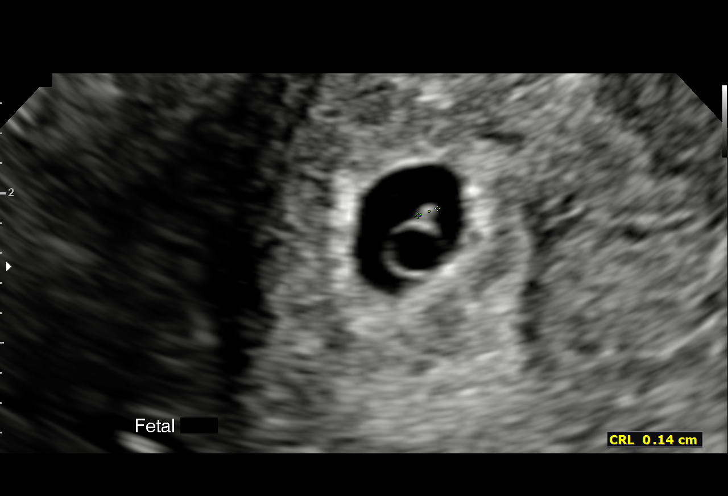
[im 53/53]
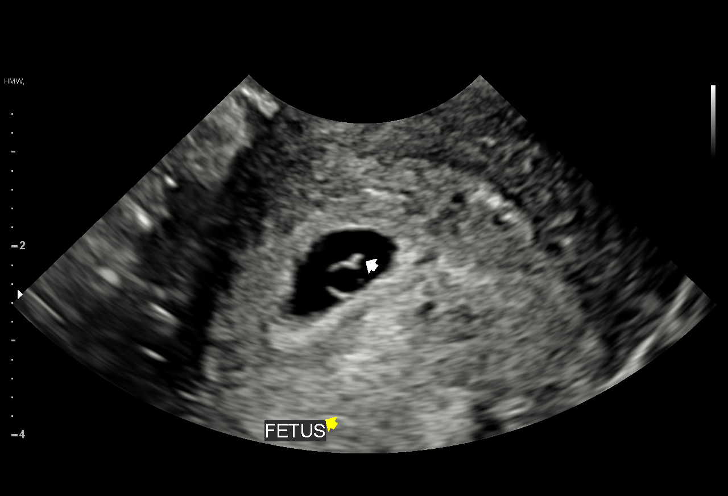

[15 of 28 positions shown; findings below may reference images not displayed]

FINDINGS: Intrauterine gestational sac: Single

Yolk sac:  Present

Embryo:  Present

Cardiac Activity: Present

Heart Rate: 52 bpm

MSD: 14 mm   6 w    2 d

CRL:  1.7 mm --  too small to calculate estimated gestational age

Subchorionic hemorrhage:  None

Maternal uterus/adnexae: Normal uterus and ovaries.  No free fluid
IMPRESSION: 1. Single intrauterine gestation with embryo and detectable cardiac
activity.

2. Estimated gestational age by mean sac diameter equal 6 weeks 2
days

## 2023-04-16 IMAGING — US US MFM OB FOLLOW-UP
1 series · 13 of 28 positions shown · non-contrast
Comparison: none

[Series 1: us mfm ob follow-up · 13 of 50 slices shown]
[im 2/50]
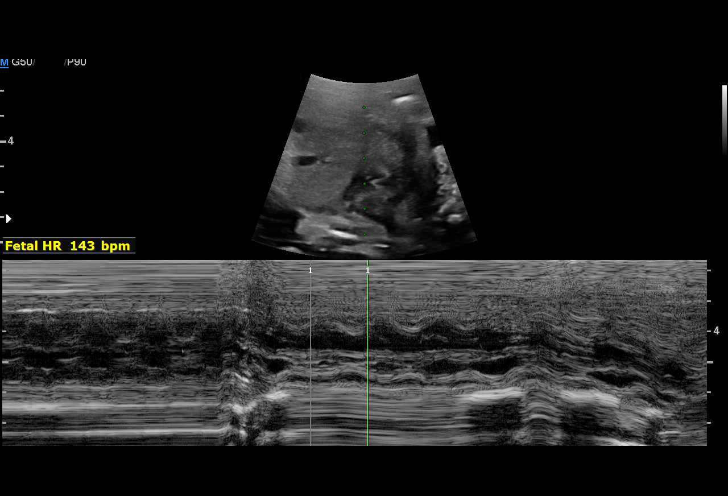
[im 6/50]
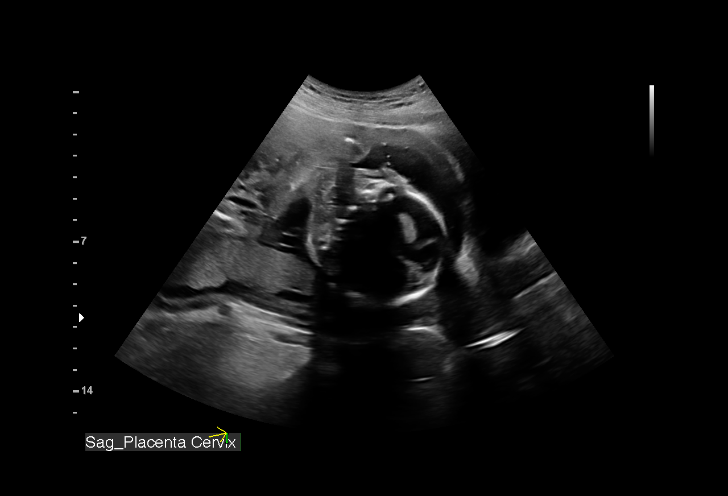
[im 10/50]
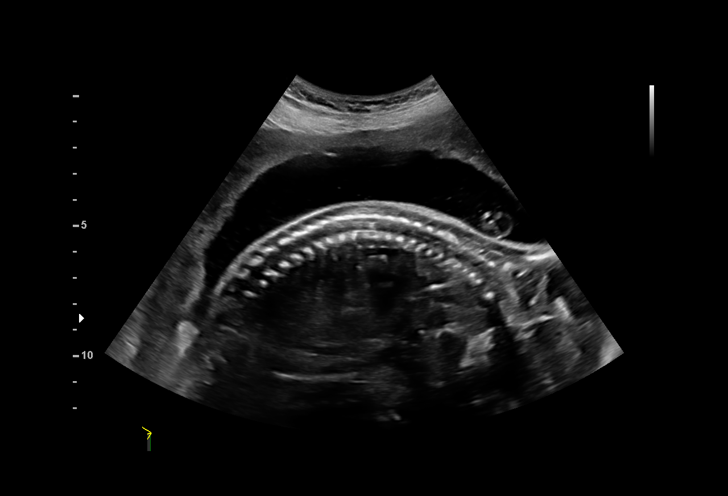
[im 13/50]
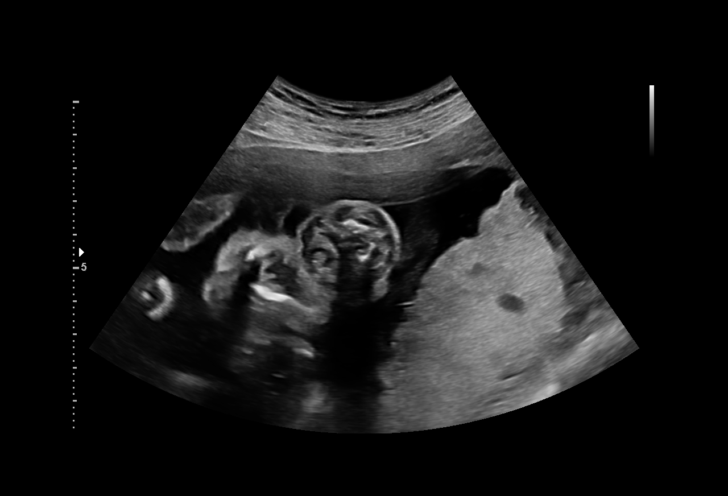
[im 17/50]
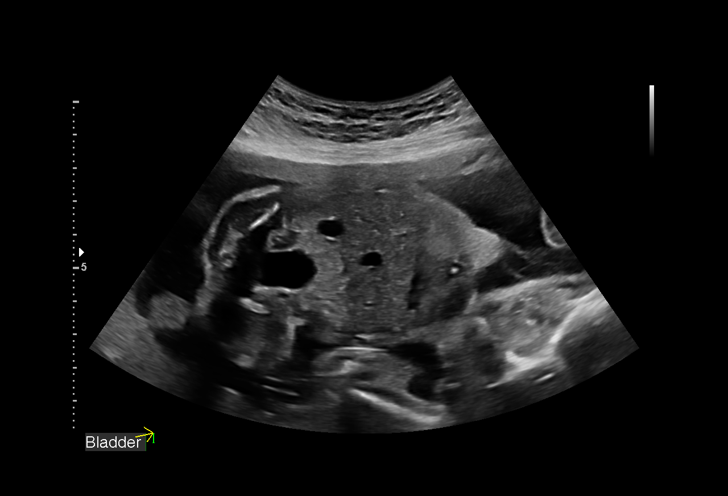
[im 20/50]
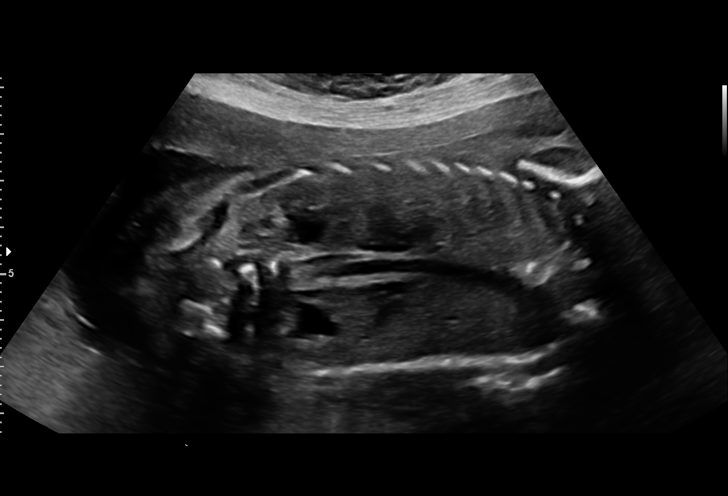
[im 26/50]
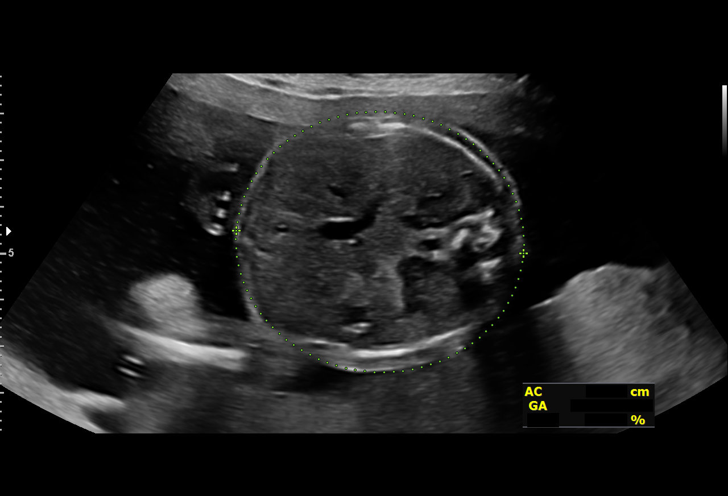
[im 30/50]
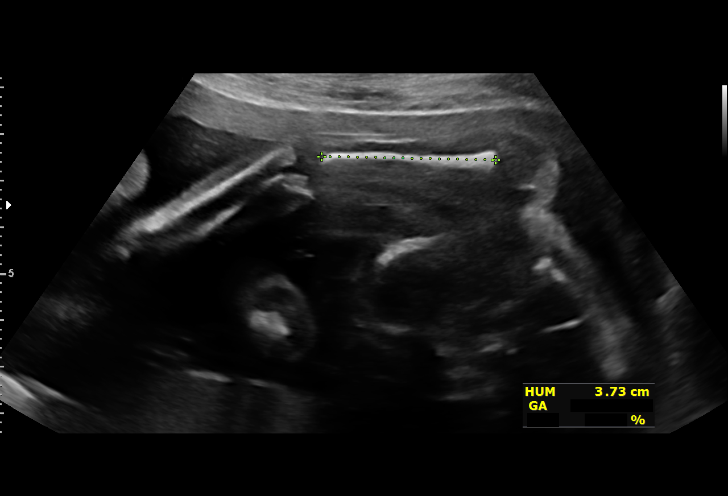
[im 33/50]
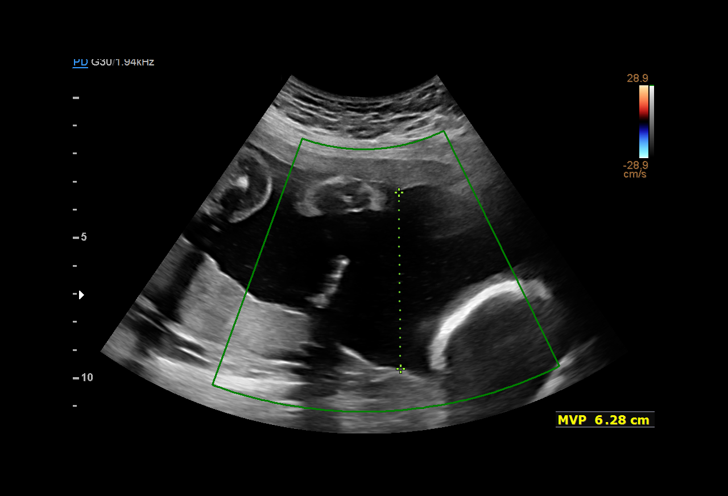
[im 37/50]
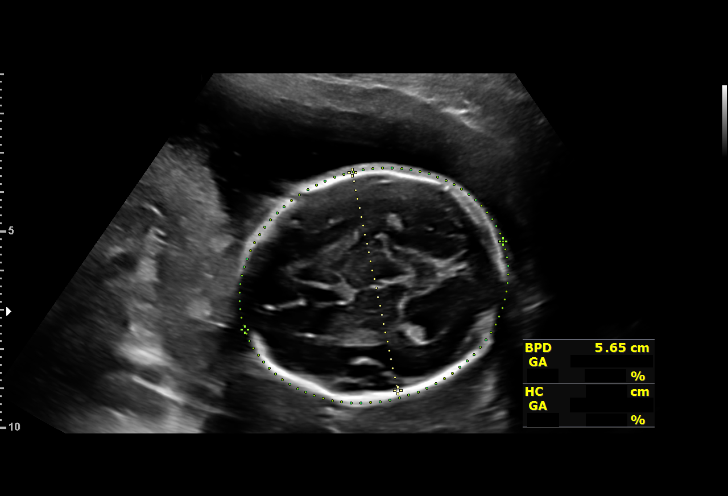
[im 40/50]
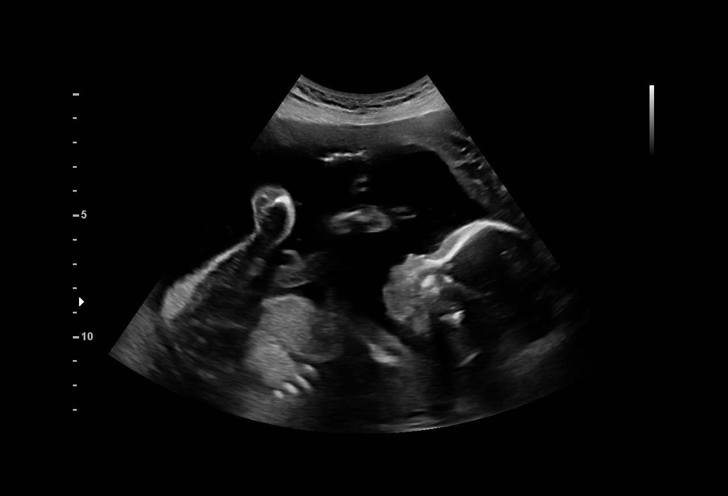
[im 44/50]
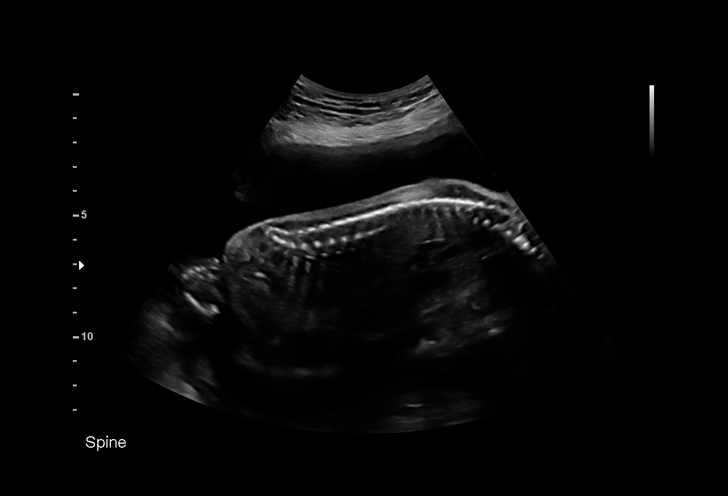
[im 48/50]
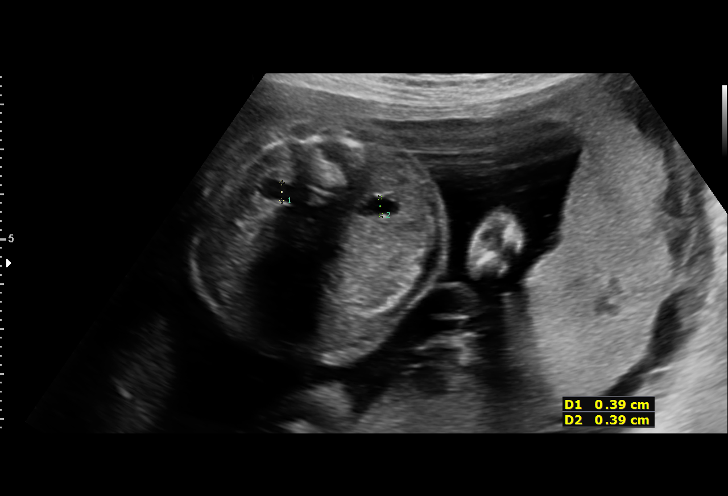

[13 of 28 positions shown; findings below may reference images not displayed]

[id number]

Indications

 23 weeks gestation of pregnancy
 Pyelectasis of fetus on prenatal ultrasound
Fetal Evaluation

 Num Of Fetuses:         1
 Fetal Heart Rate(bpm):  143
 Cardiac Activity:       Observed
 Placenta:               Posterior
 P. Cord Insertion:      Previously Visualized

                             Largest Pocket(cm)

Biometry

 BPD:      56.7  mm     G. Age:  23w 2d         58  %    CI:        77.69   %    70 - 86
                                                         FL/HC:      18.9   %    19.2 -
 HC:      203.6  mm     G. Age:  22w 3d         17  %    HC/AC:      1.09        1.05 -
 AC:      187.5  mm     G. Age:  23w 4d         59  %    FL/BPD:     67.9   %    71 - 87
 FL:       38.5  mm     G. Age:  22w 2d         19  %    FL/AC:      20.5   %    20 - 24
 HUM:      37.1  mm     G. Age:  23w 0d         42  %
 CER:        24  mm     G. Age:  22w 1d         50  %

 LV:          9  mm
 Est. FW:     551  gm      1 lb 3 oz     41  %
OB History

 Blood Type:   A+
 Gravidity:    1         Term:   0        Prem:   0        SAB:   0
 TOP:          0       Ectopic:  0        Living: 0
Gestational Age

 LMP:           23w 0d        Date:  09/21/20                 EDD:   06/28/21
 U/S Today:     22w 6d                                        EDD:   06/29/21
 Best:          23w 0d     Det. By:  LMP  (09/21/20)          EDD:   06/28/21
Anatomy

 Cranium:               Appears normal         Aortic Arch:            Previously seen
 Cavum:                 Appears normal         Ductal Arch:            Previously seen
 Ventricles:            Appears normal         Diaphragm:              Appears normal
 Choroid Plexus:        Previously seen        Stomach:                Appears normal, left
                                                                       sided
 Cerebellum:            Appears normal         Abdomen:                Previously seen
 Posterior Fossa:       Appears normal         Abdominal Wall:         Previously seen
 Nuchal Fold:           Previously seen        Cord Vessels:           Previously seen
 Face:                  Orbits and profile     Kidneys:                Bilat pyelectasis,
                        previously seen
                                                                       Rt 4mm, Lt 4mm
 Lips:                  Previously seen        Bladder:                Appears normal
 Palate:                Previously seen        Spine:                  Appears normal
 Heart:                 Previously seen        Upper Extremities:      Previously seen
 RVOT:                  Previously seen        Lower Extremities:      Previously seen
 LVOT:                  Previously seen

 Other:  Male gender previously seen. Nasal Bone, Lenses, Heels/feet and
         open hands/5th digits visualized previously.
Cervix Uterus Adnexa

 Cervix
 Length:            3.4  cm.
 Normal appearance by transabdominal scan.

 Uterus
 No abnormality visualized.

 Right Ovary
 Within normal limits.

 Left Ovary
 Within normal limits.

 Cul De Sac
 No free fluid seen.

 Adnexa
 No abnormality visualized.
Impression

 Follow up growth due to complete the fetal anatomy.
 Normal interval growth with measurements consistent with
 dates
 Good fetal movement and amniotic fluid volume

 I discussed with Dr. Aura Stella Zeraus finding of bilateral renal
 pyelectasis with a measurement of 4.1 and 4.3 mm. I
 discussed the etiology of renal pyelectasis to include normal
 variant, aneuploidy, ureteropelvic or vesicle junction
 obstruction and urethrovesicle reflux. Prior to 28 weeks the
 threshold for abnormal is <4mm but after 28 weeks >7 mm.
 The renal pelvis is [REDACTED] grade 1 appearnace without renal
 caliectasis.

 She has low risk NIPT.
 Follow up was scheduled after 28 weeks (5-7 weeks).
Recommendations

 Follow up growth in 5-7 weeks.

## 2023-04-24 ENCOUNTER — Encounter: Payer: Self-pay | Admitting: Family Medicine

## 2023-07-16 ENCOUNTER — Emergency Department (HOSPITAL_COMMUNITY): Payer: 59

## 2023-07-16 ENCOUNTER — Inpatient Hospital Stay (HOSPITAL_COMMUNITY)
Admission: EM | Admit: 2023-07-16 | Discharge: 2023-07-19 | DRG: 536 | Disposition: A | Discharge status: Home / Self Care | Payer: 59 | Attending: Surgery | Admitting: Surgery

## 2023-07-16 ENCOUNTER — Other Ambulatory Visit: Payer: Self-pay

## 2023-07-16 ENCOUNTER — Encounter (HOSPITAL_COMMUNITY): Payer: Self-pay | Admitting: Orthopedic Surgery

## 2023-07-16 DIAGNOSIS — F32A Depression, unspecified: Secondary | ICD-10-CM | POA: Diagnosis present

## 2023-07-16 DIAGNOSIS — Q211 Atrial septal defect, unspecified: Secondary | ICD-10-CM

## 2023-07-16 DIAGNOSIS — Z808 Family history of malignant neoplasm of other organs or systems: Secondary | ICD-10-CM

## 2023-07-16 DIAGNOSIS — Z8619 Personal history of other infectious and parasitic diseases: Secondary | ICD-10-CM | POA: Diagnosis not present

## 2023-07-16 DIAGNOSIS — S32810A Multiple fractures of pelvis with stable disruption of pelvic ring, initial encounter for closed fracture: Secondary | ICD-10-CM | POA: Diagnosis present

## 2023-07-16 DIAGNOSIS — S329XXA Fracture of unspecified parts of lumbosacral spine and pelvis, initial encounter for closed fracture: Secondary | ICD-10-CM | POA: Diagnosis present

## 2023-07-16 DIAGNOSIS — Z885 Allergy status to narcotic agent status: Secondary | ICD-10-CM

## 2023-07-16 DIAGNOSIS — S32412A Displaced fracture of anterior wall of left acetabulum, initial encounter for closed fracture: Principal | ICD-10-CM | POA: Diagnosis present

## 2023-07-16 DIAGNOSIS — Z803 Family history of malignant neoplasm of breast: Secondary | ICD-10-CM | POA: Diagnosis not present

## 2023-07-16 DIAGNOSIS — Z801 Family history of malignant neoplasm of trachea, bronchus and lung: Secondary | ICD-10-CM | POA: Diagnosis not present

## 2023-07-16 DIAGNOSIS — S01111A Laceration without foreign body of right eyelid and periocular area, initial encounter: Secondary | ICD-10-CM | POA: Diagnosis present

## 2023-07-16 DIAGNOSIS — Y92410 Unspecified street and highway as the place of occurrence of the external cause: Secondary | ICD-10-CM | POA: Diagnosis not present

## 2023-07-16 DIAGNOSIS — R0789 Other chest pain: Secondary | ICD-10-CM | POA: Diagnosis present

## 2023-07-16 DIAGNOSIS — R031 Nonspecific low blood-pressure reading: Secondary | ICD-10-CM | POA: Diagnosis not present

## 2023-07-16 DIAGNOSIS — R112 Nausea with vomiting, unspecified: Secondary | ICD-10-CM | POA: Diagnosis not present

## 2023-07-16 DIAGNOSIS — M25552 Pain in left hip: Secondary | ICD-10-CM | POA: Diagnosis present

## 2023-07-16 DIAGNOSIS — Z8249 Family history of ischemic heart disease and other diseases of the circulatory system: Secondary | ICD-10-CM | POA: Diagnosis not present

## 2023-07-16 DIAGNOSIS — S32119A Unspecified Zone I fracture of sacrum, initial encounter for closed fracture: Secondary | ICD-10-CM | POA: Diagnosis present

## 2023-07-16 DIAGNOSIS — S3282XA Multiple fractures of pelvis without disruption of pelvic ring, initial encounter for closed fracture: Secondary | ICD-10-CM

## 2023-07-16 DIAGNOSIS — D62 Acute posthemorrhagic anemia: Secondary | ICD-10-CM | POA: Diagnosis not present

## 2023-07-16 DIAGNOSIS — S060X0A Concussion without loss of consciousness, initial encounter: Secondary | ICD-10-CM | POA: Diagnosis present

## 2023-07-16 DIAGNOSIS — S32402A Unspecified fracture of left acetabulum, initial encounter for closed fracture: Secondary | ICD-10-CM | POA: Diagnosis present

## 2023-07-16 LAB — I-STAT CHEM 8, ED
BUN: 11 mg/dL (ref 6–20)
Calcium, Ion: 1.15 mmol/L (ref 1.15–1.40)
Chloride: 106 mmol/L (ref 98–111)
Creatinine, Ser: 0.7 mg/dL (ref 0.44–1.00)
Glucose, Bld: 96 mg/dL (ref 70–99)
HCT: 35 % — ABNORMAL LOW (ref 36.0–46.0)
Hemoglobin: 11.9 g/dL — ABNORMAL LOW (ref 12.0–15.0)
Potassium: 3.9 mmol/L (ref 3.5–5.1)
Sodium: 138 mmol/L (ref 135–145)
TCO2: 20 mmol/L — ABNORMAL LOW (ref 22–32)

## 2023-07-16 LAB — URINALYSIS, ROUTINE W REFLEX MICROSCOPIC
Bacteria, UA: NONE SEEN
Bilirubin Urine: NEGATIVE
Glucose, UA: NEGATIVE mg/dL
Ketones, ur: 5 mg/dL — AB
Leukocytes,Ua: NEGATIVE
Nitrite: NEGATIVE
Protein, ur: NEGATIVE mg/dL
Specific Gravity, Urine: 1.021 (ref 1.005–1.030)
pH: 6 (ref 5.0–8.0)

## 2023-07-16 LAB — COMPREHENSIVE METABOLIC PANEL
ALT: 19 U/L (ref 0–44)
AST: 29 U/L (ref 15–41)
Albumin: 4.1 g/dL (ref 3.5–5.0)
Alkaline Phosphatase: 42 U/L (ref 38–126)
Anion gap: 8 (ref 5–15)
BUN: 11 mg/dL (ref 6–20)
CO2: 22 mmol/L (ref 22–32)
Calcium: 8.9 mg/dL (ref 8.9–10.3)
Chloride: 105 mmol/L (ref 98–111)
Creatinine, Ser: 0.75 mg/dL (ref 0.44–1.00)
GFR, Estimated: >60 mL/min (ref >60)
Glucose, Bld: 102 mg/dL — ABNORMAL HIGH (ref 70–99)
Potassium: 3.7 mmol/L (ref 3.5–5.1)
Sodium: 135 mmol/L (ref 135–145)
Total Bilirubin: 0.9 mg/dL (ref 0.3–1.2)
Total Protein: 6.6 g/dL (ref 6.5–8.1)

## 2023-07-16 LAB — CBC
HCT: 35.5 % — ABNORMAL LOW (ref 36.0–46.0)
Hemoglobin: 11.3 g/dL — ABNORMAL LOW (ref 12.0–15.0)
MCH: 26.5 pg (ref 26.0–34.0)
MCHC: 31.8 g/dL (ref 30.0–36.0)
MCV: 83.1 fL (ref 80.0–100.0)
Platelets: 180 10*3/uL (ref 150–400)
RBC: 4.27 MIL/uL (ref 3.87–5.11)
RDW: 14.5 % (ref 11.5–15.5)
WBC: 7.1 10*3/uL (ref 4.0–10.5)
nRBC: 0 % (ref 0.0–0.2)

## 2023-07-16 LAB — PROTIME-INR
INR: 1.1 (ref 0.8–1.2)
Prothrombin Time: 14.5 s (ref 11.4–15.2)

## 2023-07-16 LAB — SAMPLE TO BLOOD BANK

## 2023-07-16 LAB — I-STAT CG4 LACTIC ACID, ED: Lactic Acid, Venous: 1.7 mmol/L (ref 0.5–1.9)

## 2023-07-16 LAB — ETHANOL: Alcohol, Ethyl (B): <10 mg/dL (ref <10)

## 2023-07-16 LAB — HCG, QUANTITATIVE, PREGNANCY: hCG, Beta Chain, Quant, S: <1 m[IU]/mL (ref <5)

## 2023-07-16 LAB — HIV ANTIBODY (ROUTINE TESTING W REFLEX): HIV Screen 4th Generation wRfx: NONREACTIVE

## 2023-07-16 MED ORDER — FENTANYL CITRATE PF 50 MCG/ML IJ SOSY
50.0000 ug | PREFILLED_SYRINGE | Freq: Once | INTRAMUSCULAR | Status: AC
  Administered 2023-07-16: 50 ug via INTRAVENOUS
  Filled 2023-07-16: qty 1

## 2023-07-16 MED ORDER — IOHEXOL 350 MG/ML SOLN
75.0000 mL | Freq: Once | INTRAVENOUS | Status: AC | PRN
  Administered 2023-07-16: 75 mL via INTRAVENOUS

## 2023-07-16 MED ORDER — TRAMADOL HCL 50 MG PO TABS: 25.0000 mg | ORAL_TABLET | Freq: Four times a day (QID) | ORAL | Status: DC | PRN

## 2023-07-16 MED ORDER — METHOCARBAMOL 1000 MG/10ML IJ SOLN
500.0000 mg | Freq: Three times a day (TID) | INTRAVENOUS | Status: DC
  Administered 2023-07-17: 500 mg via INTRAVENOUS
  Filled 2023-07-16 (×3): qty 5
  Filled 2023-07-16: qty 500
  Filled 2023-07-16: qty 5

## 2023-07-16 MED ORDER — DOCUSATE SODIUM 100 MG PO CAPS
100.0000 mg | ORAL_CAPSULE | Freq: Two times a day (BID) | ORAL | Status: DC
  Administered 2023-07-16 – 2023-07-19 (×5): 100 mg via ORAL
  Filled 2023-07-16 (×6): qty 1

## 2023-07-16 MED ORDER — TRAMADOL HCL 50 MG PO TABS
50.0000 mg | ORAL_TABLET | Freq: Four times a day (QID) | ORAL | Status: DC | PRN
  Filled 2023-07-16: qty 1

## 2023-07-16 MED ORDER — POLYETHYLENE GLYCOL 3350 17 G PO PACK: 17.0000 g | PACK | Freq: Every day | ORAL | Status: DC | PRN

## 2023-07-16 MED ORDER — METHOCARBAMOL 1000 MG/10ML IJ SOLN
1000.0000 mg | Freq: Four times a day (QID) | INTRAVENOUS | Status: DC
  Administered 2023-07-16 – 2023-07-19 (×11): 1000 mg via INTRAVENOUS
  Filled 2023-07-16: qty 1000
  Filled 2023-07-16 (×2): qty 10
  Filled 2023-07-16: qty 1000
  Filled 2023-07-16 (×3): qty 10
  Filled 2023-07-16: qty 1000
  Filled 2023-07-16: qty 10
  Filled 2023-07-16 (×3): qty 1000
  Filled 2023-07-16: qty 10
  Filled 2023-07-16 (×2): qty 1000
  Filled 2023-07-16: qty 10
  Filled 2023-07-16: qty 1000
  Filled 2023-07-16: qty 10
  Filled 2023-07-16: qty 1000

## 2023-07-16 MED ORDER — FENTANYL CITRATE PF 50 MCG/ML IJ SOSY: 50.0000 ug | PREFILLED_SYRINGE | Freq: Once | INTRAMUSCULAR | Status: DC

## 2023-07-16 MED ORDER — FENTANYL CITRATE PF 50 MCG/ML IJ SOSY
50.0000 ug | PREFILLED_SYRINGE | Freq: Once | INTRAMUSCULAR | Status: AC
  Administered 2023-07-16: 50 ug via INTRAVENOUS

## 2023-07-16 MED ORDER — ENOXAPARIN SODIUM 30 MG/0.3ML IJ SOSY: 30.0000 mg | PREFILLED_SYRINGE | Freq: Two times a day (BID) | INTRAMUSCULAR | Status: DC

## 2023-07-16 MED ORDER — ENOXAPARIN SODIUM 40 MG/0.4ML IJ SOSY: 40.0000 mg | PREFILLED_SYRINGE | INTRAMUSCULAR | Status: DC

## 2023-07-16 MED ORDER — LIDOCAINE-EPINEPHRINE (PF) 2 %-1:200000 IJ SOLN
10.0000 mL | Freq: Once | INTRAMUSCULAR | Status: AC
  Administered 2023-07-16: 10 mL
  Filled 2023-07-16: qty 20

## 2023-07-16 MED ORDER — SODIUM CHLORIDE 0.9% FLUSH: 3.0000 mL | INTRAVENOUS | Status: DC | PRN

## 2023-07-16 MED ORDER — ONDANSETRON HCL 4 MG/2ML IJ SOLN
4.0000 mg | Freq: Four times a day (QID) | INTRAMUSCULAR | Status: DC | PRN
  Administered 2023-07-16 – 2023-07-17 (×3): 4 mg via INTRAVENOUS
  Filled 2023-07-16 (×4): qty 2

## 2023-07-16 MED ORDER — METHOCARBAMOL 500 MG PO TABS: 500.0000 mg | ORAL_TABLET | Freq: Three times a day (TID) | ORAL | Status: DC

## 2023-07-16 MED ORDER — SODIUM CHLORIDE 0.9 % IV SOLN: 250.0000 mL | INTRAVENOUS | Status: DC | PRN

## 2023-07-16 MED ORDER — HYDROMORPHONE HCL 1 MG/ML IJ SOLN
0.5000 mg | Freq: Once | INTRAMUSCULAR | Status: AC
  Administered 2023-07-16: 0.5 mg via INTRAVENOUS
  Filled 2023-07-16: qty 1

## 2023-07-16 MED ORDER — ACETAMINOPHEN 500 MG PO TABS
1000.0000 mg | ORAL_TABLET | Freq: Four times a day (QID) | ORAL | Status: DC
  Administered 2023-07-17 – 2023-07-19 (×6): 1000 mg via ORAL
  Filled 2023-07-16 (×9): qty 2

## 2023-07-16 MED ORDER — HYDROMORPHONE HCL 1 MG/ML IJ SOLN
0.5000 mg | INTRAMUSCULAR | Status: DC | PRN
  Administered 2023-07-16 – 2023-07-19 (×7): 0.5 mg via INTRAVENOUS
  Filled 2023-07-16 (×7): qty 0.5

## 2023-07-16 MED ORDER — ONDANSETRON 4 MG PO TBDP
4.0000 mg | ORAL_TABLET | Freq: Four times a day (QID) | ORAL | Status: DC | PRN
  Administered 2023-07-18: 4 mg via ORAL
  Filled 2023-07-16: qty 1

## 2023-07-16 MED ORDER — ONDANSETRON HCL 4 MG/2ML IJ SOLN
4.0000 mg | Freq: Once | INTRAMUSCULAR | Status: AC
  Administered 2023-07-16: 4 mg via INTRAVENOUS
  Filled 2023-07-16: qty 2

## 2023-07-16 MED ORDER — OXYCODONE HCL 5 MG PO TABS
5.0000 mg | ORAL_TABLET | ORAL | Status: DC | PRN
  Filled 2023-07-16 (×2): qty 2

## 2023-07-16 MED ORDER — FENTANYL CITRATE PF 50 MCG/ML IJ SOSY
PREFILLED_SYRINGE | INTRAMUSCULAR | Status: AC
  Filled 2023-07-16: qty 1

## 2023-07-16 MED ORDER — HYDRALAZINE HCL 20 MG/ML IJ SOLN: 10.0000 mg | INTRAMUSCULAR | Status: DC | PRN

## 2023-07-16 MED ORDER — LIDOCAINE-EPINEPHRINE-TETRACAINE (LET) TOPICAL GEL
3.0000 mL | Freq: Once | TOPICAL | Status: AC
  Administered 2023-07-16: 3 mL via TOPICAL
  Filled 2023-07-16: qty 3

## 2023-07-16 MED ORDER — ENOXAPARIN SODIUM 30 MG/0.3ML IJ SOSY
30.0000 mg | PREFILLED_SYRINGE | Freq: Two times a day (BID) | INTRAMUSCULAR | Status: DC
  Administered 2023-07-17 – 2023-07-18 (×4): 30 mg via SUBCUTANEOUS
  Filled 2023-07-16 (×5): qty 0.3

## 2023-07-16 MED ORDER — METHOCARBAMOL 1000 MG/10ML IJ SOLN
500.0000 mg | Freq: Four times a day (QID) | INTRAVENOUS | Status: DC | PRN
  Filled 2023-07-16: qty 5

## 2023-07-16 MED ORDER — ONDANSETRON HCL 4 MG/2ML IJ SOLN
4.0000 mg | Freq: Four times a day (QID) | INTRAMUSCULAR | Status: DC | PRN
  Administered 2023-07-17 – 2023-07-19 (×4): 4 mg via INTRAVENOUS
  Filled 2023-07-16 (×4): qty 2

## 2023-07-16 MED ORDER — MORPHINE SULFATE (PF) 2 MG/ML IV SOLN
2.0000 mg | INTRAVENOUS | Status: DC | PRN
  Filled 2023-07-16 (×2): qty 1

## 2023-07-16 MED ORDER — METHOCARBAMOL 500 MG PO TABS: 500.0000 mg | ORAL_TABLET | Freq: Four times a day (QID) | ORAL | Status: DC | PRN

## 2023-07-16 MED ORDER — TRAMADOL HCL 50 MG PO TABS: 50.0000 mg | ORAL_TABLET | Freq: Four times a day (QID) | ORAL | Status: DC | PRN

## 2023-07-16 MED ORDER — ONDANSETRON HCL 4 MG/2ML IJ SOLN: 4.0000 mg | Freq: Once | INTRAMUSCULAR | Status: DC

## 2023-07-16 MED ORDER — ONDANSETRON HCL 4 MG PO TABS: 4.0000 mg | ORAL_TABLET | Freq: Four times a day (QID) | ORAL | Status: DC | PRN

## 2023-07-16 MED ORDER — SODIUM CHLORIDE 0.9% FLUSH
3.0000 mL | Freq: Two times a day (BID) | INTRAVENOUS | Status: DC
  Administered 2023-07-16 – 2023-07-19 (×5): 3 mL via INTRAVENOUS

## 2023-07-16 NOTE — Plan of Care (Signed)
  Problem: Education: Goal: Knowledge of General Education information will improve Description: Including pain rating scale, medication(s)/side effects and non-pharmacologic comfort measures Outcome: Progressing   Problem: Clinical Measurements: Goal: Will remain free from infection Outcome: Progressing   Problem: Activity: Goal: Risk for activity intolerance will decrease Outcome: Progressing   Problem: Nutrition: Goal: Adequate nutrition will be maintained Outcome: Progressing   Problem: Pain Managment: Goal: General experience of comfort will improve Outcome: Progressing   
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
## 2023-07-16 NOTE — Progress Notes (Signed)
Responded to page to provided spiritual and emotional support.  Venida Jarvis, [place], [facility], Pager [phone]
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
## 2023-07-16 NOTE — Plan of Care (Signed)
  Problem: Clinical Measurements: Goal: Will remain free from infection Outcome: Progressing Goal: Respiratory complications will improve Outcome: Progressing Goal: Cardiovascular complication will be avoided Outcome: Progressing   Problem: Pain Managment: Goal: General experience of comfort will improve Outcome: Progressing   
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
## 2023-07-17 ENCOUNTER — Inpatient Hospital Stay (HOSPITAL_COMMUNITY): Payer: 59

## 2023-07-17 LAB — BASIC METABOLIC PANEL
Anion gap: 9 (ref 5–15)
BUN: <5 mg/dL — ABNORMAL LOW (ref 6–20)
CO2: 20 mmol/L — ABNORMAL LOW (ref 22–32)
Calcium: 8.7 mg/dL — ABNORMAL LOW (ref 8.9–10.3)
Chloride: 106 mmol/L (ref 98–111)
Creatinine, Ser: 0.72 mg/dL (ref 0.44–1.00)
GFR, Estimated: >60 mL/min (ref >60)
Glucose, Bld: 124 mg/dL — ABNORMAL HIGH (ref 70–99)
Potassium: 3.5 mmol/L (ref 3.5–5.1)
Sodium: 135 mmol/L (ref 135–145)

## 2023-07-17 LAB — CBC
HCT: 31.9 % — ABNORMAL LOW (ref 36.0–46.0)
Hemoglobin: 10.5 g/dL — ABNORMAL LOW (ref 12.0–15.0)
MCH: 28.1 pg (ref 26.0–34.0)
MCHC: 32.9 g/dL (ref 30.0–36.0)
MCV: 85.3 fL (ref 80.0–100.0)
Platelets: 150 10*3/uL (ref 150–400)
RBC: 3.74 MIL/uL — ABNORMAL LOW (ref 3.87–5.11)
RDW: 14.7 % (ref 11.5–15.5)
WBC: 6.3 10*3/uL (ref 4.0–10.5)
nRBC: 0 % (ref 0.0–0.2)

## 2023-07-17 MED ORDER — KETOROLAC TROMETHAMINE 15 MG/ML IJ SOLN
15.0000 mg | Freq: Four times a day (QID) | INTRAMUSCULAR | Status: DC
  Administered 2023-07-17 – 2023-07-19 (×6): 15 mg via INTRAVENOUS
  Filled 2023-07-17 (×7): qty 1

## 2023-07-17 MED ORDER — ENSURE ENLIVE PO LIQD
237.0000 mL | Freq: Two times a day (BID) | ORAL | Status: DC
  Administered 2023-07-17 – 2023-07-19 (×4): 237 mL via ORAL

## 2023-07-17 MED ORDER — TRAMADOL HCL 50 MG PO TABS: 50.0000 mg | ORAL_TABLET | Freq: Four times a day (QID) | ORAL | Status: DC | PRN

## 2023-07-17 MED ORDER — DIPHENHYDRAMINE HCL 25 MG PO CAPS: 25.0000 mg | ORAL_CAPSULE | Freq: Four times a day (QID) | ORAL | Status: DC | PRN

## 2023-07-17 MED ORDER — METOCLOPRAMIDE HCL 5 MG PO TABS
5.0000 mg | ORAL_TABLET | Freq: Four times a day (QID) | ORAL | Status: DC | PRN
  Administered 2023-07-17 – 2023-07-18 (×3): 5 mg via ORAL
  Filled 2023-07-17 (×4): qty 1

## 2023-07-17 MED ORDER — TRAMADOL HCL 50 MG PO TABS
50.0000 mg | ORAL_TABLET | Freq: Four times a day (QID) | ORAL | Status: DC | PRN
  Administered 2023-07-17 (×2): 50 mg via ORAL
  Administered 2023-07-18 (×2): 100 mg via ORAL
  Administered 2023-07-19: 50 mg via ORAL
  Filled 2023-07-17 (×3): qty 1
  Filled 2023-07-17 (×2): qty 2

## 2023-07-17 MED ORDER — OXYCODONE HCL 5 MG PO TABS: 5.0000 mg | ORAL_TABLET | ORAL | Status: DC | PRN

## 2023-07-17 MED ORDER — SODIUM CHLORIDE 0.9 % IV SOLN: INTRAVENOUS | Status: DC

## 2023-07-17 NOTE — Plan of Care (Signed)
  Problem: Pain Managment: Goal: General experience of comfort will improve Outcome: Progressing   Problem: Safety: Goal: Ability to remain free from injury will improve Outcome: Progressing   Problem: Skin Integrity: Goal: Risk for impaired skin integrity will decrease Outcome: Progressing   
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
## 2023-07-17 NOTE — TOC CAGE-AID Note (Signed)
Transition of Care [facility]) - CAGE-AID Screening   Patient Details  Name: Bridget Price MRN: [id number] Date of Birth: 10/14/1991  Transition [facility]) CM/SW Contact:    Leota Sauers, RN Phone Number: 07/17/2023, 6:03 AM   Clinical Narrative:  Patient endorses social use of alcohol, denies illicit drug use. Resources not given at this time.  CAGE-AID Screening:    Have You Ever Felt You Ought to Cut Down on Your Drinking or Drug Use?: No Have People Annoyed You By Critizing Your Drinking Or Drug Use?: No Have You Felt Bad Or Guilty About Your Drinking Or Drug Use?: No Have You Ever Had a Drink or Used Drugs First Thing In The Morning to Steady Your Nerves or to Get Rid of a Hangover?: No CAGE-AID Score: 0  Substance Abuse Education Offered: No
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
## 2023-07-17 NOTE — Progress Notes (Signed)
Inpatient Rehab Admissions Coordinator Note:   Per PT recommendations patient was screened for CIR candidacy by Stephania Fragmin, PT. At this time, pt appears to be a potential candidate for CIR. I will place an order for rehab consult for full assessment, per our protocol.  Please contact me any with questions.Estill Dooms, PT, DPT [phone] 07/17/23 10:55 AM
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
## 2023-07-18 ENCOUNTER — Encounter (HOSPITAL_COMMUNITY): Admission: EM | Disposition: A | Discharge status: Home / Self Care | Payer: Self-pay | Source: Home / Self Care

## 2023-07-18 LAB — CBC
HCT: 29.3 % — ABNORMAL LOW (ref 36.0–46.0)
Hemoglobin: 9.3 g/dL — ABNORMAL LOW (ref 12.0–15.0)
MCH: 26.6 pg (ref 26.0–34.0)
MCHC: 31.7 g/dL (ref 30.0–36.0)
MCV: 84 fL (ref 80.0–100.0)
Platelets: 122 10*3/uL — ABNORMAL LOW (ref 150–400)
RBC: 3.49 MIL/uL — ABNORMAL LOW (ref 3.87–5.11)
RDW: 14.6 % (ref 11.5–15.5)
WBC: 3.9 10*3/uL — ABNORMAL LOW (ref 4.0–10.5)
nRBC: 0 % (ref 0.0–0.2)

## 2023-07-18 LAB — BASIC METABOLIC PANEL
Anion gap: 7 (ref 5–15)
BUN: 5 mg/dL — ABNORMAL LOW (ref 6–20)
CO2: 24 mmol/L (ref 22–32)
Calcium: 8.4 mg/dL — ABNORMAL LOW (ref 8.9–10.3)
Chloride: 107 mmol/L (ref 98–111)
Creatinine, Ser: 0.6 mg/dL (ref 0.44–1.00)
GFR, Estimated: >60 mL/min (ref >60)
Glucose, Bld: 112 mg/dL — ABNORMAL HIGH (ref 70–99)
Potassium: 3.8 mmol/L (ref 3.5–5.1)
Sodium: 138 mmol/L (ref 135–145)

## 2023-07-18 SURGERY — ORIF PELVIC FRACTURE WITH PERCUTANEOUS SCREWS
Anesthesia: General | Laterality: Left

## 2023-07-18 MED ORDER — PROCHLORPERAZINE EDISYLATE 10 MG/2ML IJ SOLN
10.0000 mg | Freq: Four times a day (QID) | INTRAMUSCULAR | Status: DC | PRN
  Administered 2023-07-18: 10 mg via INTRAVENOUS
  Filled 2023-07-18: qty 2

## 2023-07-18 NOTE — Discharge Instructions (Signed)
Partial weightbearing at 25% for left lower extremity
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
## 2023-07-18 NOTE — Progress Notes (Signed)
Inpatient Rehabilitation Admissions Coordinator   Up in shower and working with therapy. Noted preference to discharge home. We will sign off at this time.  Ottie Glazier, RN, MSN Rehab Admissions Coordinator [phone] 07/18/2023 10:50 AM
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
## 2023-07-18 NOTE — Plan of Care (Signed)
  Problem: Coping: Goal: Level of anxiety will decrease Outcome: Progressing   Problem: Elimination: Goal: Will not experience complications related to bowel motility Outcome: Progressing Goal: Will not experience complications related to urinary retention Outcome: Progressing   Problem: Pain Managment: Goal: General experience of comfort will improve Outcome: Progressing   
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
## 2023-07-18 NOTE — Plan of Care (Signed)
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
## 2023-07-18 NOTE — Progress Notes (Signed)
Pt c/o severe nausea w/ small amount of vomiting in spite of Zofran and Reglan, contacted on-call, S. McClung, orders given for Compazine and okay for patient to have small ice chips- no large amounts or gulps of water. *Pt also stated that she wishes to hold all meds overnight due to severe nausea during administration of Compazine, believes taking all meds while barely eating/NPO is causing nausea, feels okay pain-wise right now and wants to focus on controlling/eliminating nausea.

## 2023-07-19 ENCOUNTER — Other Ambulatory Visit (HOSPITAL_COMMUNITY): Payer: Self-pay

## 2023-07-19 LAB — BASIC METABOLIC PANEL
Anion gap: 6 (ref 5–15)
BUN: <5 mg/dL — ABNORMAL LOW (ref 6–20)
CO2: 25 mmol/L (ref 22–32)
Calcium: 8.2 mg/dL — ABNORMAL LOW (ref 8.9–10.3)
Chloride: 108 mmol/L (ref 98–111)
Creatinine, Ser: 0.55 mg/dL (ref 0.44–1.00)
GFR, Estimated: >60 mL/min (ref >60)
Glucose, Bld: 102 mg/dL — ABNORMAL HIGH (ref 70–99)
Potassium: 3.5 mmol/L (ref 3.5–5.1)
Sodium: 139 mmol/L (ref 135–145)

## 2023-07-19 LAB — CBC
HCT: 27.2 % — ABNORMAL LOW (ref 36.0–46.0)
Hemoglobin: 8.6 g/dL — ABNORMAL LOW (ref 12.0–15.0)
MCH: 26.6 pg (ref 26.0–34.0)
MCHC: 31.6 g/dL (ref 30.0–36.0)
MCV: 84.2 fL (ref 80.0–100.0)
Platelets: 127 10*3/uL — ABNORMAL LOW (ref 150–400)
RBC: 3.23 MIL/uL — ABNORMAL LOW (ref 3.87–5.11)
RDW: 14.6 % (ref 11.5–15.5)
WBC: 3.4 10*3/uL — ABNORMAL LOW (ref 4.0–10.5)
nRBC: 0 % (ref 0.0–0.2)

## 2023-07-19 MED ORDER — ACETAMINOPHEN 500 MG PO TABS: 1000.0000 mg | ORAL_TABLET | Freq: Four times a day (QID) | ORAL | 0 refills | Status: AC

## 2023-07-19 MED ORDER — METHOCARBAMOL 500 MG PO TABS
500.0000 mg | ORAL_TABLET | Freq: Four times a day (QID) | ORAL | 1 refills | Status: DC | PRN
Start: 2023-07-19 — End: 2023-07-19

## 2023-07-19 MED ORDER — METHOCARBAMOL 500 MG PO TABS
500.0000 mg | ORAL_TABLET | Freq: Four times a day (QID) | ORAL | 1 refills | Status: DC | PRN
Start: 2023-07-19 — End: 2024-08-02
  Filled 2023-07-19 (×2): qty 40, 5d supply, fill #0

## 2023-07-19 MED ORDER — POLYETHYLENE GLYCOL 3350 17 G PO PACK: 17.0000 g | PACK | Freq: Every day | ORAL | 0 refills | Status: AC | PRN

## 2023-07-19 MED ORDER — IBUPROFEN 600 MG PO TABS
600.0000 mg | ORAL_TABLET | Freq: Three times a day (TID) | ORAL | 0 refills | Status: DC | PRN
  Filled 2023-07-19 (×2): qty 25, 9d supply, fill #0

## 2023-07-19 MED ORDER — TRAMADOL HCL 50 MG PO TABS: 50.0000 mg | ORAL_TABLET | Freq: Four times a day (QID) | ORAL | 0 refills | Status: DC | PRN

## 2023-07-19 MED ORDER — DOCUSATE SODIUM 100 MG PO CAPS: 100.0000 mg | ORAL_CAPSULE | Freq: Two times a day (BID) | ORAL | 0 refills | Status: AC

## 2023-07-19 MED ORDER — IBUPROFEN 600 MG PO TABS: 600.0000 mg | ORAL_TABLET | Freq: Three times a day (TID) | ORAL | 0 refills | Status: DC | PRN

## 2023-07-19 MED ORDER — ASPIRIN 325 MG PO TBEC
325.0000 mg | DELAYED_RELEASE_TABLET | Freq: Two times a day (BID) | ORAL | 6 refills | Status: AC
Start: 2023-07-19 — End: 2024-07-18
  Filled 2023-07-19: qty 100, 50d supply, fill #0

## 2023-07-19 MED ORDER — TRAMADOL HCL 50 MG PO TABS
50.0000 mg | ORAL_TABLET | Freq: Four times a day (QID) | ORAL | 0 refills | Status: AC | PRN
  Filled 2023-07-19: qty 30, 4d supply, fill #0
  Filled 2023-07-19 (×2): qty 30, 5d supply, fill #0

## 2023-07-19 NOTE — Progress Notes (Signed)
AVS and meds given to patient.
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
## 2023-07-19 NOTE — Plan of Care (Signed)

## 2023-10-02 ENCOUNTER — Other Ambulatory Visit (HOSPITAL_COMMUNITY): Payer: Self-pay

## 2023-10-16 DIAGNOSIS — D259 Leiomyoma of uterus, unspecified: Secondary | ICD-10-CM

## 2023-10-16 HISTORY — DX: Leiomyoma of uterus, unspecified: D25.9

## 2024-06-12 NOTE — ED Provider Notes (Signed)
 Patient placed in First Look pathway, seen and evaluated for chief complaint of [redacted]wks pregnant - reports filling up pads but this started less than an hour ago and she started bleeding ~80min ago.  Pertinent exam findings include non-toxic in appearance, speech is clear, and ambulates with a steady gait. Based on initial evaluation, labs are currently indicated and radiology studies are not currently indicated as allowed for current processes and treatments as applicable in a triage setting and could be different than if patient were seen in a main treatment area or dependent on labs/imagining after results are displayed.  Patient counseled on process, plan, and necessity for staying for completing the evaluation.   This document serves as a record of services personally performed by Maranda Grate PA-C.   Emergency Department Provider Note  This document was created using the aid of voice recognition Dragon dictation software.  History   Chief Complaint  Patient presents with  . Vaginal Bleeding - Pregnant     HPI  Bridget Price is a 33 y.o. female who presents to the ED with complaints of vaginal bleeding started 1030 this morning.  Patient states she is went through 4 pads today.  Home pregnancy test was +7 days ago.  Patient followed up with OB yesterday and had labs drawn but does not know the results.  G3 P11.  LMP ~04/30/2024. Reports some lower abdominal cramping denies fever, chills, dizziness, nausea, vomiting.   Past Medical History   Medical History[1]  Physical Exam   Vitals:   06/12/24 1111 06/12/24 1600  BP: 112/80 112/68  BP Location: Left arm   Patient Position: Sitting   Pulse: 80 77  Resp: 16 18  Temp: 98.6 F (37 C)   TempSrc: Oral   SpO2: 100% 100%  Height: 160 cm (5' 3)     Physical Exam Constitutional:      Appearance: Normal appearance. She is normal weight.  HENT:     Head: Normocephalic and atraumatic.     Nose: Nose normal.      Mouth/Throat:     Mouth: Mucous membranes are moist.  Eyes:     Extraocular Movements: Extraocular movements intact.  Cardiovascular:     Rate and Rhythm: Normal rate.  Pulmonary:     Effort: Pulmonary effort is normal.     Breath sounds: Normal breath sounds.  Abdominal:     Palpations: Abdomen is soft.  Skin:    General: Skin is warm.  Neurological:     Mental Status: She is alert and oriented to person, place, and time.  Psychiatric:        Mood and Affect: Mood normal.     Procedure Note  Procedures   ED Clinical Impression   1. Vaginal bleeding    ED Assessment / Plan   ED Course as of 06/12/24 1631  Fri Jun 12, 2024  1609 RhoGAM blood type a positive reviewed on [place] labs from 11/17/2020 [DD]  1628  Patient presents to the ED today for vaginal bleeding.  Vital signs hemodynamically stable.  Initial presentation patient resting comfortably, alert and oriented x 3, no obvious distress.  CBC reassuring for no profound leukocytosis, hemoglobin of 12.4 stable, no evidence of thrombocytopenia.  CMP is reassuring for no profound electrolyte abnormalities, anion gap acidosis or AKI.    Beta hCG level is 20 which does not correlate with [redacted] weeks gestational age and likely indicating fetal demise.  No significant abdominal pain and with a beta  hCG level this have a decrease suspicion for ectopic pregnancy.  Patient's not a candidate for RhoGAM injection today based off lab review.  Hemoglobin is stable and not bleeding significantly today to require further observation on the inpatient side.  Patient to follow-up with OB/GYN to trend beta-hCG levels. [DD]    ED Course User Index [DD] Toribio Kava Day, PA-C    Patient placed on to cardiac telemetry for close monitoring, telemetry interpretation: Sinus rhythm, rate appropriate.  Upon reassessing patient, patient was resting comfortably. Based on the above findings, I believe patient is hemodynamically stable for  discharge.  Patient/and family educated about specific return precautions for given chief complaint and symptoms.  Patient/and family educated about follow-up with PCP and OB/GYN.  Patient/and family expressed understanding of return precautions and need for follow-up.  Patient discharged.   Clinical Complexity  Patient's presentation is most consistent with acute presentation with potential threat to life or bodily function.  Decision to admit or increased level of care requiring transfer: No: Evaluation and diagnostic workup in ED does not suggest an emergent condition requiring admission or immediate observation beyond what has been performed at this time. The patient is safe for discharge and has been instructed to return for worsening symptoms, change in symptoms, or any other concerns.  n/a  OTC medications advised and discussed with patient include Motrin  and Tylenol .  Prescription medications discussed: none  Discussed options for workup with patient including risks and benefits via shared decision making and decided to proceed with workup.  Evidence based calculators if applicable:      Medical Decision Making   Differentials considered: DDX: dysfunctional uterine bleeding, PCOS, contraceptives, cervititis, fibroids, polyps, adenomyosis, endometrial cancer, ectopic pregnancy, retained products of conception.   Clinical Complexity Number and complexity of problems addressed: Medical Decision Making Problems Addressed: Vaginal bleeding: complicated acute illness or injury  Amount and/or Complexity of Data Reviewed External Data Reviewed: labs.    Details: Documented in ED course Labs: ordered. Decision-making details documented in ED Course.    I considered ordering ultrasound OB <14 wks, however, after further ED work up and history I felt this was unnecessary.  Emergency Department Medication Summary: Medications - No data to display  ED Disposition: ED  Disposition     ED Disposition  Discharge   Condition  Stable   Comment  --              [1] Past Medical History: Diagnosis Date  . Acne   . ASD (atrial septal defect) (CMD)    s/p repair 2015  . Dengue hemorrhagic fever   . Vaginal delivery (CMD)    2022

## 2024-06-19 ENCOUNTER — Telehealth: Payer: Self-pay

## 2024-06-19 DIAGNOSIS — O039 Complete or unspecified spontaneous abortion without complication: Secondary | ICD-10-CM

## 2024-06-19 NOTE — Telephone Encounter (Signed)
 Called patient to schedule her for a repeat HCG. Patient states she is in [place] doing a wedding but she can come in today at 4pm. Patient aware that our office will close at 12 pm for the day. Advised patient to go to [address] to have labs drawn when she arrives at 4pm. Understanding was voiced. Grae Cannata l Lulia Schriner, CMA

## 2024-06-19 NOTE — Telephone Encounter (Signed)
-----   Message from Rollo ONEIDA Bring sent at 06/17/2024  1:43 PM EDT ----- Regarding: RE: SAB Repeat HCG on 9/5 ----- Message ----- From: Tanda York CROME, CMA Sent: 06/17/2024   1:31 PM EDT To: Rollo ONEIDA Bring, MD Subject: SAB                                            Patient had SAB and was seen at [facility]. Please advise

## 2024-06-20 LAB — BETA HCG QUANT (REF LAB): hCG Quant: <1 m[IU]/mL

## 2024-06-22 ENCOUNTER — Ambulatory Visit: Payer: Self-pay | Admitting: Obstetrics and Gynecology

## 2024-06-22 NOTE — Telephone Encounter (Signed)
 Patient aware of results, requested to call follow up appt on Thursday.  Bridget Price

## 2024-06-22 NOTE — Telephone Encounter (Signed)
-----   Message from Rollo ONEIDA Bring sent at 06/22/2024  8:11 AM EDT ----- HCG is negative, consistent with complete miscarriage. Please review with patient. Thanks. ----- Message ----- From: Rebecka Memos Lab Results In Sent: 06/20/2024   5:37 AM EDT To: Rollo ONEIDA Bring, MD

## 2024-06-25 ENCOUNTER — Ambulatory Visit: Payer: Self-pay | Admitting: Family Medicine

## 2024-06-29 ENCOUNTER — Encounter

## 2024-07-16 ENCOUNTER — Other Ambulatory Visit: Payer: Self-pay

## 2024-07-16 DIAGNOSIS — Z3490 Encounter for supervision of normal pregnancy, unspecified, unspecified trimester: Secondary | ICD-10-CM

## 2024-07-17 ENCOUNTER — Ambulatory Visit: Payer: Self-pay | Admitting: Family Medicine

## 2024-07-17 LAB — BETA HCG QUANT (REF LAB): hCG Quant: 1227 m[IU]/mL

## 2024-07-20 NOTE — Telephone Encounter (Signed)
 Spoke with patient.  Will schedule [facility] intake and appt with provider.  Erminio DELENA Rumps, RN

## 2024-07-20 NOTE — Telephone Encounter (Signed)
-----   Message from Lang JINNY Peel sent at 07/17/2024 12:42 PM EDT ----- About 4-[redacted] weeks pregnant. Needs dating US  in 2 weeks ----- Message ----- From: Interface, [facility] Lab Results In Sent: 07/17/2024  10:36 AM EDT To: Jacob J Stinson, DO

## 2024-07-22 ENCOUNTER — Encounter: Admitting: Family Medicine

## 2024-08-02 ENCOUNTER — Inpatient Hospital Stay (HOSPITAL_COMMUNITY): Payer: PRIVATE HEALTH INSURANCE

## 2024-08-02 ENCOUNTER — Encounter (HOSPITAL_COMMUNITY): Payer: Self-pay | Admitting: *Deleted

## 2024-08-02 ENCOUNTER — Inpatient Hospital Stay (HOSPITAL_COMMUNITY)
Admission: AD | Admit: 2024-08-02 | Discharge: 2024-08-02 | Disposition: A | Discharge status: Home / Self Care | Payer: PRIVATE HEALTH INSURANCE | Attending: Obstetrics & Gynecology | Admitting: Obstetrics & Gynecology

## 2024-08-02 DIAGNOSIS — O26891 Other specified pregnancy related conditions, first trimester: Secondary | ICD-10-CM

## 2024-08-02 DIAGNOSIS — Z3A14 14 weeks gestation of pregnancy: Secondary | ICD-10-CM | POA: Insufficient documentation

## 2024-08-02 DIAGNOSIS — O26852 Spotting complicating pregnancy, second trimester: Secondary | ICD-10-CM | POA: Insufficient documentation

## 2024-08-02 DIAGNOSIS — O09292 Supervision of pregnancy with other poor reproductive or obstetric history, second trimester: Secondary | ICD-10-CM | POA: Insufficient documentation

## 2024-08-02 DIAGNOSIS — N939 Abnormal uterine and vaginal bleeding, unspecified: Secondary | ICD-10-CM | POA: Diagnosis not present

## 2024-08-02 DIAGNOSIS — Z3A01 Less than 8 weeks gestation of pregnancy: Secondary | ICD-10-CM | POA: Diagnosis not present

## 2024-08-02 DIAGNOSIS — D252 Subserosal leiomyoma of uterus: Secondary | ICD-10-CM | POA: Diagnosis not present

## 2024-08-02 DIAGNOSIS — D259 Leiomyoma of uterus, unspecified: Secondary | ICD-10-CM | POA: Insufficient documentation

## 2024-08-02 DIAGNOSIS — O3412 Maternal care for benign tumor of corpus uteri, second trimester: Secondary | ICD-10-CM | POA: Insufficient documentation

## 2024-08-02 HISTORY — DX: Urinary tract infection, site not specified: N39.0

## 2024-08-02 HISTORY — DX: Anemia, unspecified: D64.9

## 2024-08-02 LAB — CBC
HCT: 36.4 % (ref 36.0–46.0)
Hemoglobin: 12.3 g/dL (ref 12.0–15.0)
MCH: 29.6 pg (ref 26.0–34.0)
MCHC: 33.8 g/dL (ref 30.0–36.0)
MCV: 87.7 fL (ref 80.0–100.0)
Platelets: 185 K/uL (ref 150–400)
RBC: 4.15 MIL/uL (ref 3.87–5.11)
RDW: 14.4 % (ref 11.5–15.5)
WBC: 8 K/uL (ref 4.0–10.5)
nRBC: 0 % (ref 0.0–0.2)

## 2024-08-02 LAB — HCG, QUANTITATIVE, PREGNANCY: hCG, Beta Chain, Quant, S: 134862 m[IU]/mL — ABNORMAL HIGH (ref <5)

## 2024-08-02 LAB — WET PREP, GENITAL
Clue Cells Wet Prep HPF POC: NONE SEEN
Sperm: NONE SEEN
Trich, Wet Prep: NONE SEEN
WBC, Wet Prep HPF POC: >=10 — AB (ref <10)
Yeast Wet Prep HPF POC: NONE SEEN

## 2024-08-02 NOTE — Discharge Instructions (Signed)
 Thank you for trusting us  with your care today.  There were no signs of any infection that we need to treat.  Your ultrasound is reassuring for the current wellbeing of your pregnancy and the measurements tell us  that you are at [redacted] weeks gestation.  The ultrasound also showed a fibroid on the back part of your uterus.  This can cause vaginal bleeding and pelvic pain during and outside of pregnancy.  It is good that we were able to see the fibroid on today's ultrasound so that we know about it moving forward.  We will continue to monitor your fibroid throughout pregnancy, and I have added it to your chart so that your OB provider at our [facility] office is also aware.  Reasons to return to MAU at [facility]: If you have heavier bleeding that soaks through more that 2 pads per hour for an hour or more If you bleed so much that you feel like you might pass out or you do pass out If you have significant abdominal pain that is not improved with Tylenol  1000 mg every 8 hours as needed for pain If you develop a fever > 100.5

## 2024-08-02 NOTE — MAU Note (Signed)
 Bridget Price is a 33 y.o. at Unknown here in MAU reporting: last night had some cramping that kind of kept her up, had some brown spotting this morning  and some cramping in her back.  Took another preg test this morning and the line was fainter.  LMP: had miscarriage end of Aug, no period since then Onset of complaint: last night Pain score: abd mild/ back mild Vitals:   08/02/24 1337  BP: 115/64  Pulse: 84  Resp: 16  Temp: 99.3 F (37.4 C)  SpO2: 100%      Lab orders placed from triage:     Had HCG on 10/2, US  is scheduled for tomorrow.
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
## 2024-08-03 ENCOUNTER — Other Ambulatory Visit: Payer: Self-pay

## 2024-08-03 ENCOUNTER — Ambulatory Visit (INDEPENDENT_AMBULATORY_CARE_PROVIDER_SITE_OTHER): Payer: PRIVATE HEALTH INSURANCE

## 2024-08-03 DIAGNOSIS — Z3401 Encounter for supervision of normal first pregnancy, first trimester: Secondary | ICD-10-CM

## 2024-08-03 DIAGNOSIS — O0990 Supervision of high risk pregnancy, unspecified, unspecified trimester: Secondary | ICD-10-CM | POA: Insufficient documentation

## 2024-08-03 DIAGNOSIS — Z3A08 8 weeks gestation of pregnancy: Secondary | ICD-10-CM

## 2024-08-03 LAB — GC/CHLAMYDIA PROBE AMP (~~LOC~~) NOT AT ARMC
Chlamydia: NEGATIVE
Comment: NEGATIVE
Comment: NORMAL
Neisseria Gonorrhea: NEGATIVE

## 2024-08-03 MED ORDER — PROMETHAZINE HCL 25 MG PO TABS: 25.0000 mg | ORAL_TABLET | Freq: Four times a day (QID) | ORAL | 2 refills | Status: DC | PRN

## 2024-08-03 NOTE — Progress Notes (Signed)
 New OB Intake  I explained I am completing New OB Intake today. We discussed EDD of 03/21/2025, by Ultrasound. Pt is [id number]. I reviewed her allergies, medications and Medical/Surgical/OB history.    Patient Active Problem List   Diagnosis Date Noted   Uterine fibroid 08/02/2024   History of repair of congenital atrial septal defect (ASD) 11/17/2020    Concerns addressed today  Patient informed that the ultrasound is considered a limited obstetric ultrasound and is not intended to be a complete ultrasound exam.  Patient also informed that the ultrasound is not being completed with the intent of assessing for fetal or placental anomalies or any pelvic abnormalities. Explained that the purpose of today's ultrasound is to assess for viability.  Patient acknowledges the purpose of the exam and the limitations of the study.     Delivery Plans Plans to deliver at [facility]. Discussed the nature of our practice with multiple providers including residents and students. Due to the size of the practice, the delivering provider may not be the same as those providing prenatal care.   MyChart/Babyscripts MyChart access verified. I explained pt will have some visits in office and some virtually. Babyscripts app discussed and ordered.   Blood Pressure Cuff Blood pressure cuff discussedDiscussed to be used for virtual visits and or if needed BP checks weekly.  Anatomy US  Explained first scheduled US  will be around 19 weeks.   Last Pap No results found for: DIAGPAP  First visit review I reviewed new OB appt with patient. Explained pt will be seen by Dr. Barbra at first visit. Discussed [place] genetic screening with patient. Routine prenatal labs to be drawn at next appt.    Erminio DELENA Rumps, [place] 08/03/2024  9:49 AM

## 2024-08-20 ENCOUNTER — Telehealth: Payer: Self-pay

## 2024-08-20 NOTE — Telephone Encounter (Signed)
 Pt called stating that she has been having diarrhea I told the pt to take the meds off of the med sheet that we provided at her intake and to increase her fluids.   Shawnee Fleet, CMA

## 2024-09-03 ENCOUNTER — Other Ambulatory Visit (HOSPITAL_COMMUNITY)
Admission: RE | Admit: 2024-09-03 | Discharge: 2024-09-03 | Disposition: A | Payer: PRIVATE HEALTH INSURANCE | Source: Ambulatory Visit | Attending: Family Medicine | Admitting: Family Medicine

## 2024-09-03 ENCOUNTER — Ambulatory Visit: Payer: PRIVATE HEALTH INSURANCE | Admitting: Family Medicine

## 2024-09-03 VITALS — BP 117/63 | HR 97 | Wt 124.0 lb

## 2024-09-03 DIAGNOSIS — Z8774 Personal history of (corrected) congenital malformations of heart and circulatory system: Secondary | ICD-10-CM | POA: Diagnosis not present

## 2024-09-03 DIAGNOSIS — Z3A11 11 weeks gestation of pregnancy: Secondary | ICD-10-CM

## 2024-09-03 DIAGNOSIS — Z8781 Personal history of (healed) traumatic fracture: Secondary | ICD-10-CM

## 2024-09-03 DIAGNOSIS — Z3401 Encounter for supervision of normal first pregnancy, first trimester: Secondary | ICD-10-CM | POA: Insufficient documentation

## 2024-09-03 DIAGNOSIS — Z8759 Personal history of other complications of pregnancy, childbirth and the puerperium: Secondary | ICD-10-CM | POA: Diagnosis not present

## 2024-09-03 MED ORDER — PROMETHAZINE HCL 25 MG PO TABS: 25.0000 mg | ORAL_TABLET | Freq: Four times a day (QID) | ORAL | 2 refills | Status: DC | PRN
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
## 2024-09-04 ENCOUNTER — Ambulatory Visit: Payer: Self-pay | Admitting: Family Medicine

## 2024-09-04 DIAGNOSIS — Z3401 Encounter for supervision of normal first pregnancy, first trimester: Secondary | ICD-10-CM

## 2024-09-04 LAB — CBC/D/PLT+RPR+RH+ABO+RUBIGG...
Antibody Screen: NEGATIVE
Basophils Absolute: 0 x10E3/uL (ref 0.0–0.2)
Basos: 1 %
EOS (ABSOLUTE): 0.2 x10E3/uL (ref 0.0–0.4)
Eos: 2 %
HCV Ab: NONREACTIVE
HIV Screen 4th Generation wRfx: NONREACTIVE
Hematocrit: 40.5 % (ref 34.0–46.6)
Hemoglobin: 13.3 g/dL (ref 11.1–15.9)
Hepatitis B Surface Ag: NEGATIVE
Immature Grans (Abs): 0 x10E3/uL (ref 0.0–0.1)
Immature Granulocytes: 0 %
Lymphocytes Absolute: 1.6 x10E3/uL (ref 0.7–3.1)
Lymphs: 18 %
MCH: 29.9 pg (ref 26.6–33.0)
MCHC: 32.8 g/dL (ref 31.5–35.7)
MCV: 91 fL (ref 79–97)
Monocytes Absolute: 0.5 x10E3/uL (ref 0.1–0.9)
Monocytes: 6 %
Neutrophils Absolute: 6.3 x10E3/uL (ref 1.4–7.0)
Neutrophils: 73 %
Platelets: 214 x10E3/uL (ref 150–450)
RBC: 4.45 x10E6/uL (ref 3.77–5.28)
RDW: 13.7 % (ref 11.7–15.4)
RPR Ser Ql: NONREACTIVE
Rh Factor: POSITIVE
Rubella Antibodies, IGG: 2.65 {index} (ref >0.99)
WBC: 8.7 x10E3/uL (ref 3.4–10.8)

## 2024-09-04 LAB — HCV INTERPRETATION

## 2024-09-04 LAB — HEMOGLOBIN A1C
Est. average glucose Bld gHb Est-mCnc: 97 mg/dL
Hgb A1c MFr Bld: 5 % (ref 4.8–5.6)

## 2024-09-05 LAB — CULTURE, OB URINE

## 2024-09-05 LAB — URINE CULTURE, OB REFLEX: Organism ID, Bacteria: NO GROWTH

## 2024-09-07 LAB — CERVICOVAGINAL ANCILLARY ONLY
Chlamydia: NEGATIVE
Comment: NEGATIVE
Comment: NORMAL
Neisseria Gonorrhea: NEGATIVE

## 2024-09-11 LAB — PANORAMA PRENATAL TEST FULL PANEL:PANORAMA TEST PLUS 5 ADDITIONAL MICRODELETIONS: FETAL FRACTION: 7.6

## 2024-09-30 ENCOUNTER — Encounter: Payer: PRIVATE HEALTH INSURANCE | Admitting: Obstetrics and Gynecology

## 2024-10-12 ENCOUNTER — Ambulatory Visit (INDEPENDENT_AMBULATORY_CARE_PROVIDER_SITE_OTHER): Payer: PRIVATE HEALTH INSURANCE

## 2024-10-12 VITALS — BP 114/52 | HR 75 | Ht 62.0 in | Wt 137.0 lb

## 2024-10-12 DIAGNOSIS — R300 Dysuria: Secondary | ICD-10-CM | POA: Diagnosis not present

## 2024-10-12 DIAGNOSIS — Z3A17 17 weeks gestation of pregnancy: Secondary | ICD-10-CM

## 2024-10-12 LAB — POCT URINALYSIS DIPSTICK
Bilirubin, UA: NEGATIVE
Blood, UA: NEGATIVE
Glucose, UA: NEGATIVE
Ketones, UA: NEGATIVE
Nitrite, UA: NEGATIVE
Protein, UA: POSITIVE — AB
Spec Grav, UA: 1.02
Urobilinogen, UA: NEGATIVE U/dL — AB
pH, UA: 7

## 2024-10-12 NOTE — Progress Notes (Signed)
 SUBJECTIVE:  33 y.o. female complains of  dysuria, urinary urgency, and urinary frequency since yesterday Denies abnormal vaginal bleeding or significant pelvic pain or fever.Denies history of known exposure to STD.  No LMP recorded (approximate). Patient is pregnant.  OBJECTIVE:  She appears alert, well appearing, in no apparent distress Urine dipstick: positive for protein and positive for leukocytes.  ASSESSMENT:  Dysuria     PLAN:   urine culture sent to lab. Treatment: To be determined once lab results are received ROV prn if symptoms persist or worsen.   Erminio DELENA Rumps, RN

## 2024-10-14 LAB — URINE CULTURE, OB REFLEX: Organism ID, Bacteria: NO GROWTH

## 2024-10-14 LAB — CULTURE, OB URINE

## 2024-10-19 DIAGNOSIS — O09299 Supervision of pregnancy with other poor reproductive or obstetric history, unspecified trimester: Secondary | ICD-10-CM | POA: Insufficient documentation

## 2024-10-23 ENCOUNTER — Other Ambulatory Visit: Payer: Self-pay

## 2024-10-23 DIAGNOSIS — Z3401 Encounter for supervision of normal first pregnancy, first trimester: Secondary | ICD-10-CM

## 2024-10-23 DIAGNOSIS — O219 Vomiting of pregnancy, unspecified: Secondary | ICD-10-CM

## 2024-10-23 MED ORDER — PROMETHAZINE HCL 25 MG PO TABS: 25.0000 mg | ORAL_TABLET | Freq: Four times a day (QID) | ORAL | 2 refills | Status: AC | PRN

## 2024-10-28 ENCOUNTER — Ambulatory Visit: Payer: PRIVATE HEALTH INSURANCE | Admitting: Family Medicine

## 2024-10-28 VITALS — BP 108/56 | HR 72 | Wt 139.1 lb

## 2024-10-28 DIAGNOSIS — Z3401 Encounter for supervision of normal first pregnancy, first trimester: Secondary | ICD-10-CM

## 2024-10-28 DIAGNOSIS — Z8774 Personal history of (corrected) congenital malformations of heart and circulatory system: Secondary | ICD-10-CM

## 2024-10-28 DIAGNOSIS — Z3A19 19 weeks gestation of pregnancy: Secondary | ICD-10-CM

## 2024-10-28 DIAGNOSIS — O09292 Supervision of pregnancy with other poor reproductive or obstetric history, second trimester: Secondary | ICD-10-CM | POA: Diagnosis not present

## 2024-10-29 ENCOUNTER — Other Ambulatory Visit: Payer: Self-pay | Admitting: Obstetrics and Gynecology

## 2024-10-29 ENCOUNTER — Other Ambulatory Visit: Payer: PRIVATE HEALTH INSURANCE

## 2024-10-29 ENCOUNTER — Ambulatory Visit: Payer: PRIVATE HEALTH INSURANCE | Attending: Obstetrics and Gynecology | Admitting: Maternal & Fetal Medicine

## 2024-10-29 VITALS — BP 112/60 | HR 77

## 2024-10-29 DIAGNOSIS — O09213 Supervision of pregnancy with history of pre-term labor, third trimester: Secondary | ICD-10-CM | POA: Diagnosis not present

## 2024-10-29 DIAGNOSIS — D563 Thalassemia minor: Secondary | ICD-10-CM

## 2024-10-29 DIAGNOSIS — Z3A19 19 weeks gestation of pregnancy: Secondary | ICD-10-CM | POA: Diagnosis not present

## 2024-10-29 DIAGNOSIS — Z8774 Personal history of (corrected) congenital malformations of heart and circulatory system: Secondary | ICD-10-CM | POA: Diagnosis not present

## 2024-10-29 DIAGNOSIS — O99012 Anemia complicating pregnancy, second trimester: Secondary | ICD-10-CM | POA: Diagnosis not present

## 2024-10-29 DIAGNOSIS — Z3401 Encounter for supervision of normal first pregnancy, first trimester: Secondary | ICD-10-CM

## 2024-10-29 DIAGNOSIS — Z8759 Personal history of other complications of pregnancy, childbirth and the puerperium: Secondary | ICD-10-CM | POA: Insufficient documentation

## 2024-10-29 DIAGNOSIS — Z3689 Encounter for other specified antenatal screening: Secondary | ICD-10-CM | POA: Diagnosis present

## 2024-10-29 DIAGNOSIS — Z8751 Personal history of pre-term labor: Secondary | ICD-10-CM | POA: Insufficient documentation

## 2024-10-29 DIAGNOSIS — O0990 Supervision of high risk pregnancy, unspecified, unspecified trimester: Secondary | ICD-10-CM

## 2024-10-29 DIAGNOSIS — O09292 Supervision of pregnancy with other poor reproductive or obstetric history, second trimester: Secondary | ICD-10-CM

## 2024-10-29 DIAGNOSIS — Z141 Cystic fibrosis carrier: Secondary | ICD-10-CM | POA: Diagnosis not present

## 2024-10-29 DIAGNOSIS — D259 Leiomyoma of uterus, unspecified: Secondary | ICD-10-CM

## 2024-10-29 NOTE — Progress Notes (Signed)
 "  PRENATAL VISIT NOTE  Subjective:  Bridget Price is a 34 y.o. H6E9888 at [redacted]w[redacted]d being seen today for ongoing prenatal care.  She is currently monitored for the following issues for this high-risk pregnancy and has History of repair of congenital atrial septal defect (ASD); Uterine fibroid; Encounter for supervision of normal first pregnancy in first trimester; and History of premature rupture of membranes in previous pregnancy, currently pregnant on their problem list.  Patient reports no complaints.  Contractions: Not present. Vag. Bleeding: None.  Movement: Present. Denies leaking of fluid.   The following portions of the patient's history were reviewed and updated as appropriate: allergies, current medications, past family history, past medical history, past social history, past surgical history and problem list.   Objective:   Vitals:   10/28/24 1032  BP: (!) 108/56  Pulse: 72  Weight: 139 lb 1.9 oz (63.1 kg)    Fetal Status:  Fetal Heart Rate (bpm): 146   Movement: Present    General: Alert, oriented and cooperative. Patient is in no acute distress.  Skin: Skin is warm and dry. No rash noted.   Cardiovascular: Normal heart rate noted  Respiratory: Normal respiratory effort, no problems with respiration noted  Abdomen: Soft, gravid, appropriate for gestational age.  Pain/Pressure: Absent     Pelvic: Cervical exam deferred        Extremities: Normal range of motion.  Edema: None  Mental Status: Normal mood and affect. Normal behavior. Normal judgment and thought content.      09/03/2024    2:53 PM  Depression screen PHQ 2/9  Decreased Interest 0  Down, Depressed, Hopeless 0  PHQ - 2 Score 0  Altered sleeping 0  Tired, decreased energy 1  Change in appetite 0  Feeling bad or failure about yourself  0  Trouble concentrating 0  Moving slowly or fidgety/restless 0  Suicidal thoughts 0  PHQ-9 Score 1        09/03/2024    2:54 PM  GAD 7 : Generalized Anxiety Score   Nervous, Anxious, on Edge 1  Control/stop worrying 0  Worry too much - different things 1  Trouble relaxing 0  Restless 0  Easily annoyed or irritable 0  Afraid - awful might happen 1  Total GAD 7 Score 3    Assessment and Plan:  Pregnancy: H6E9888 at [redacted]w[redacted]d  1. [redacted] weeks gestation of pregnancy (Primary)  2. Encounter for supervision of normal first pregnancy in first trimester FHT normal  3. History of repair of congenital atrial septal defect (ASD) Needs fetal echo  4. History of premature rupture of membranes in previous pregnancy, currently pregnant in second trimester Will watch closely   Preterm labor symptoms and general obstetric precautions including but not limited to vaginal bleeding, contractions, leaking of fluid and fetal movement were reviewed in detail with the patient. Please refer to After Visit Summary for other counseling recommendations.   No follow-ups on file.  Future Appointments  Date Time Provider Department Center  10/29/2024  1:00 PM [facility] PROVIDER 1 [facility]  10/29/2024  1:30 PM [facility] US1 [facility]  11/26/2024 10:55 AM Carissa Musick J, DO [facility] None  12/24/2024  8:35 AM Jenascia Bumpass J, DO [facility] None  01/07/2025  9:35 AM Sandee Bernath J, DO [facility] None  02/04/2025 10:15 AM [facility] MIDWIFE [facility] None  02/18/2025 10:15 AM [facility] MIDWIFE [facility] None  02/25/2025 10:15 AM [facility] MIDWIFE [facility] None  03/04/2025 10:15 AM [facility] MIDWIFE [facility] None  03/11/2025  10:15 AM [facility] MIDWIFE [facility] None  03/18/2025 10:15 AM [facility] MIDWIFE [facility] None    Lang JINNY Peel, DO "
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
## 2024-11-02 ENCOUNTER — Encounter: Payer: Self-pay | Admitting: Obstetrics and Gynecology

## 2024-11-02 DIAGNOSIS — O283 Abnormal ultrasonic finding on antenatal screening of mother: Secondary | ICD-10-CM | POA: Insufficient documentation

## 2024-11-26 ENCOUNTER — Encounter: Payer: PRIVATE HEALTH INSURANCE | Admitting: Family Medicine

## 2024-12-24 ENCOUNTER — Encounter: Payer: PRIVATE HEALTH INSURANCE | Admitting: Family Medicine

## 2025-01-07 ENCOUNTER — Encounter: Payer: PRIVATE HEALTH INSURANCE | Admitting: Family Medicine
